# Patient Record
Sex: Female | Born: 1935
Health system: Southern US, Community
[De-identification: ages and names within clinical notes are randomized; demographics above are authoritative.]

## PROBLEM LIST (undated history)

## (undated) DIAGNOSIS — J3089 Other allergic rhinitis: Secondary | ICD-10-CM

## (undated) DIAGNOSIS — K449 Diaphragmatic hernia without obstruction or gangrene: Secondary | ICD-10-CM

## (undated) DIAGNOSIS — M1990 Unspecified osteoarthritis, unspecified site: Secondary | ICD-10-CM

## (undated) DIAGNOSIS — K219 Gastro-esophageal reflux disease without esophagitis: Secondary | ICD-10-CM

## (undated) DIAGNOSIS — I499 Cardiac arrhythmia, unspecified: Secondary | ICD-10-CM

## (undated) HISTORY — PX: ROTATOR CUFF REPAIR: SHX139

## (undated) HISTORY — DX: Unspecified osteoarthritis, unspecified site: M19.90

## (undated) HISTORY — PX: OTHER SURGICAL HISTORY: SHX169

## (undated) HISTORY — PX: DUPUYTREN CONTRACTURE RELEASE: SHX1478

## (undated) HISTORY — PX: WRIST SURGERY: SHX841

## (undated) HISTORY — PX: KNEE SURGERY: SHX244

## (undated) HISTORY — PX: CATARACT EXTRACTION W/ INTRAOCULAR LENS  IMPLANT, BILATERAL: SHX1307

---

## 2004-11-18 ENCOUNTER — Other Ambulatory Visit: Payer: Self-pay

## 2004-11-18 ENCOUNTER — Ambulatory Visit: Payer: Self-pay | Admitting: Unknown Physician Specialty

## 2004-11-19 ENCOUNTER — Ambulatory Visit: Payer: Self-pay | Admitting: Unknown Physician Specialty

## 2005-09-01 ENCOUNTER — Ambulatory Visit: Payer: Self-pay | Admitting: Internal Medicine

## 2005-09-21 ENCOUNTER — Other Ambulatory Visit: Payer: Self-pay

## 2005-10-16 ENCOUNTER — Inpatient Hospital Stay: Payer: Self-pay | Admitting: General Practice

## 2005-10-19 ENCOUNTER — Encounter: Payer: Self-pay | Admitting: Internal Medicine

## 2005-10-28 ENCOUNTER — Encounter: Payer: Self-pay | Admitting: Internal Medicine

## 2005-10-30 ENCOUNTER — Encounter: Payer: Self-pay | Admitting: General Practice

## 2005-11-27 ENCOUNTER — Encounter: Payer: Self-pay | Admitting: Internal Medicine

## 2005-12-28 ENCOUNTER — Encounter: Payer: Self-pay | Admitting: Internal Medicine

## 2006-01-28 ENCOUNTER — Encounter: Payer: Self-pay | Admitting: Internal Medicine

## 2006-02-25 ENCOUNTER — Encounter: Payer: Self-pay | Admitting: Internal Medicine

## 2006-03-28 ENCOUNTER — Encounter: Payer: Self-pay | Admitting: Internal Medicine

## 2006-04-27 ENCOUNTER — Encounter: Payer: Self-pay | Admitting: Internal Medicine

## 2006-05-28 ENCOUNTER — Encounter: Payer: Self-pay | Admitting: Internal Medicine

## 2006-06-27 ENCOUNTER — Encounter: Payer: Self-pay | Admitting: Internal Medicine

## 2006-07-28 ENCOUNTER — Encounter: Payer: Self-pay | Admitting: Internal Medicine

## 2006-08-28 ENCOUNTER — Encounter: Payer: Self-pay | Admitting: Internal Medicine

## 2006-09-27 ENCOUNTER — Encounter: Payer: Self-pay | Admitting: Internal Medicine

## 2006-09-29 ENCOUNTER — Ambulatory Visit: Payer: Self-pay | Admitting: Internal Medicine

## 2006-10-28 ENCOUNTER — Encounter: Payer: Self-pay | Admitting: Internal Medicine

## 2007-03-16 ENCOUNTER — Ambulatory Visit: Payer: Self-pay | Admitting: Internal Medicine

## 2007-07-04 ENCOUNTER — Ambulatory Visit: Payer: Self-pay | Admitting: Unknown Physician Specialty

## 2007-10-03 ENCOUNTER — Ambulatory Visit: Payer: Self-pay | Admitting: Internal Medicine

## 2008-10-03 ENCOUNTER — Ambulatory Visit: Payer: Self-pay | Admitting: Internal Medicine

## 2009-10-16 ENCOUNTER — Ambulatory Visit: Payer: Self-pay | Admitting: Internal Medicine

## 2010-11-03 ENCOUNTER — Ambulatory Visit: Payer: Self-pay | Admitting: Internal Medicine

## 2011-12-07 ENCOUNTER — Ambulatory Visit: Payer: Self-pay | Admitting: Internal Medicine

## 2012-07-20 ENCOUNTER — Ambulatory Visit: Payer: Self-pay | Admitting: Ophthalmology

## 2012-07-20 DIAGNOSIS — I499 Cardiac arrhythmia, unspecified: Secondary | ICD-10-CM

## 2012-08-09 ENCOUNTER — Ambulatory Visit: Payer: Self-pay | Admitting: Ophthalmology

## 2012-12-07 ENCOUNTER — Ambulatory Visit: Payer: Self-pay | Admitting: Internal Medicine

## 2013-03-28 ENCOUNTER — Ambulatory Visit: Payer: Self-pay | Admitting: Ophthalmology

## 2013-04-11 ENCOUNTER — Ambulatory Visit: Payer: Self-pay | Admitting: Ophthalmology

## 2013-12-12 ENCOUNTER — Ambulatory Visit: Payer: Self-pay | Admitting: Internal Medicine

## 2014-05-22 DIAGNOSIS — M810 Age-related osteoporosis without current pathological fracture: Secondary | ICD-10-CM | POA: Insufficient documentation

## 2014-05-22 DIAGNOSIS — E785 Hyperlipidemia, unspecified: Secondary | ICD-10-CM | POA: Insufficient documentation

## 2014-12-25 ENCOUNTER — Ambulatory Visit: Payer: Self-pay | Admitting: Internal Medicine

## 2015-04-16 NOTE — Op Note (Signed)
PATIENT NAME:  Heather Miranda, Heather Miranda MR#:  [id number] DATE OF BIRTH:  [birth year]  DATE OF PROCEDURE:  08/09/2012  PREOPERATIVE DIAGNOSIS: Visually significant cataract of the right eye.   POSTOPERATIVE DIAGNOSIS: Visually significant cataract of the right eye.   OPERATIVE PROCEDURE: Cataract extraction by phacoemulsification with implant of intraocular lens to right eye.   SURGEON: Birder Robson, MD.   ANESTHESIA:  1. Managed anesthesia care.  2. Topical tetracaine drops followed by 2% Xylocaine jelly applied in the preoperative holding area.   COMPLICATIONS: None.   TECHNIQUE:  Stop-and-chop    DESCRIPTION OF PROCEDURE: The patient was examined and consented in the preoperative holding area where the aforementioned topical anesthesia was applied to the right eye and then brought back to the Operating Room where the right eye was prepped and draped in the usual sterile ophthalmic fashion and a lid speculum was placed. A paracentesis was created with the side port blade and the anterior chamber was filled with viscoelastic. A near clear corneal incision was performed with the steel keratome. A continuous curvilinear capsulorrhexis was performed with a cystotome followed by the capsulorrhexis forceps. Hydrodissection and hydrodelineation were carried out with BSS on a blunt cannula. The lens was removed in a stop-and-chop technique and the remaining cortical material was removed with the irrigation-aspiration handpiece. The capsular bag was inflated with viscoelastic and the Technus ZCB00 23.0-diopter lens, serial number [id number] was placed in the capsular bag without complication. The remaining viscoelastic was removed from the eye with the irrigation-aspiration handpiece. The wounds were hydrated. The anterior chamber was flushed with Miostat and the eye was inflated to physiologic pressure. The wounds were found to be water tight. The eye was dressed with Vigamox. The patient was given protective  glasses to wear throughout the day and a shield with which to sleep tonight. The patient was also given drops with which to begin a drop regimen today and will follow-up with me in one day.   ____________________________ Livingston Diones. Prentis Langdon, MD wlp:drc D: 08/09/2012 16:59:57 ET T: 08/09/2012 17:43:02 ET JOB#: [id number]  cc: [place] Nick L. Rondrick Barreira, MD, <Dictator> Livingston Diones Kirat Mezquita MD ELECTRONICALLY SIGNED 08/10/2012 15:32

## 2015-04-19 NOTE — Op Note (Signed)
PATIENT NAME:  Heather Miranda, Heather Miranda MR#:  [id number] DATE OF BIRTH:  1929/12/26  DATE OF PROCEDURE:  04/11/2013  PREOPERATIVE DIAGNOSIS: Visually significant cataract of the left eye.   POSTOPERATIVE DIAGNOSIS: Visually significant cataract of the left eye.   OPERATIVE PROCEDURE: Cataract extraction by phacoemulsification with implant of intraocular lens to left eye.   SURGEON: Birder Robson, MD.   ANESTHESIA:  1. Managed anesthesia care.  2. Topical tetracaine drops followed by 2% Xylocaine jelly applied in the preoperative holding area.   COMPLICATIONS: None.   TECHNIQUE:  Stop and chop.  DESCRIPTION OF PROCEDURE: The patient was examined and consented in the preoperative holding area where the aforementioned topical anesthesia was applied to the left eye and then brought back to the Operating Room where the left eye was prepped and draped in the usual sterile ophthalmic fashion and a lid speculum was placed. A paracentesis was created with the side port blade and the anterior chamber was filled with viscoelastic. A near clear corneal incision was performed with the steel keratome. A continuous curvilinear capsulorrhexis was performed with a cystotome followed by the capsulorrhexis forceps. Hydrodissection and hydrodelineation were carried out with BSS on a blunt cannula. The lens was removed in a stop and chop technique and the remaining cortical material was removed with the irrigation-aspiration handpiece. The capsular bag was inflated with viscoelastic and the Tecnis ZCB00 23.0-diopter lens, serial number [id number] was placed in the capsular bag without complication. The remaining viscoelastic was removed from the eye with the irrigation-aspiration handpiece. The wounds were hydrated. The anterior chamber was flushed with Miostat and the eye was inflated to physiologic pressure. 0.1 mL of cefuroxime concentration 10 mg/mL was placed in the anterior chamber. The wounds were found to be water  tight. The eye was dressed with Vigamox. The patient was given protective glasses to wear throughout the day and a shield with which to sleep tonight. The patient was also given drops with which to begin a drop regimen today and will follow-up with me in one day.    ____________________________ Livingston Diones. Cornelio Parkerson, MD wlp:OSi D: 04/11/2013 12:41:04 ET T: 04/11/2013 13:34:58 ET JOB#: [id number]  cc: Custer Pimenta L. Artez Regis, MD, <Dictator> Livingston Diones Bard Haupert MD ELECTRONICALLY SIGNED 04/12/2013 13:45

## 2015-09-13 DIAGNOSIS — Z86018 Personal history of other benign neoplasm: Secondary | ICD-10-CM

## 2015-09-13 HISTORY — DX: Personal history of other benign neoplasm: Z86.018

## 2015-10-14 DIAGNOSIS — Z96651 Presence of right artificial knee joint: Secondary | ICD-10-CM | POA: Insufficient documentation

## 2015-11-27 ENCOUNTER — Other Ambulatory Visit: Payer: Self-pay | Admitting: Internal Medicine

## 2015-11-27 DIAGNOSIS — Z1231 Encounter for screening mammogram for malignant neoplasm of breast: Secondary | ICD-10-CM

## 2015-12-27 ENCOUNTER — Ambulatory Visit
Admission: RE | Admit: 2015-12-27 | Discharge: 2015-12-27 | Disposition: A | Discharge status: Home / Self Care | Payer: Medicare Other | Source: Ambulatory Visit | Attending: Internal Medicine | Admitting: Internal Medicine

## 2015-12-27 DIAGNOSIS — Z1231 Encounter for screening mammogram for malignant neoplasm of breast: Secondary | ICD-10-CM

## 2016-02-10 DIAGNOSIS — M47812 Spondylosis without myelopathy or radiculopathy, cervical region: Secondary | ICD-10-CM | POA: Insufficient documentation

## 2016-02-10 DIAGNOSIS — M159 Polyosteoarthritis, unspecified: Secondary | ICD-10-CM | POA: Insufficient documentation

## 2016-02-10 DIAGNOSIS — M542 Cervicalgia: Secondary | ICD-10-CM | POA: Insufficient documentation

## 2016-03-06 ENCOUNTER — Emergency Department: Payer: Medicare Other

## 2016-03-06 ENCOUNTER — Encounter: Payer: Self-pay | Admitting: Emergency Medicine

## 2016-03-06 ENCOUNTER — Emergency Department
Admission: EM | Admit: 2016-03-06 | Discharge: 2016-03-07 | Disposition: A | Discharge status: Home / Self Care | Payer: Medicare Other | Attending: Emergency Medicine | Admitting: Emergency Medicine

## 2016-03-06 DIAGNOSIS — R079 Chest pain, unspecified: Secondary | ICD-10-CM | POA: Diagnosis present

## 2016-03-06 LAB — CBC
HCT: 40.9 % (ref 35.0–47.0)
Hemoglobin: 13.9 g/dL (ref 12.0–16.0)
MCH: 31.1 pg (ref 26.0–34.0)
MCHC: 34 g/dL (ref 32.0–36.0)
MCV: 91.4 fL (ref 80.0–100.0)
Platelets: 287 10*3/uL (ref 150–440)
RBC: 4.47 MIL/uL (ref 3.80–5.20)
RDW: 13.2 % (ref 11.5–14.5)
WBC: 16 10*3/uL — ABNORMAL HIGH (ref 3.6–11.0)

## 2016-03-06 LAB — BASIC METABOLIC PANEL
ANION GAP: 12 (ref 5–15)
BUN: 27 mg/dL — AB (ref 6–20)
CALCIUM: 12 mg/dL — AB (ref 8.9–10.3)
CO2: 25 mmol/L (ref 22–32)
Chloride: 94 mmol/L — ABNORMAL LOW (ref 101–111)
Creatinine, Ser: 1.07 mg/dL — ABNORMAL HIGH (ref 0.44–1.00)
GFR calc Af Amer: 54 mL/min — ABNORMAL LOW (ref >60)
GFR, EST NON AFRICAN AMERICAN: 46 mL/min — AB (ref >60)
GLUCOSE: 156 mg/dL — AB (ref 65–99)
Potassium: 3.8 mmol/L (ref 3.5–5.1)
Sodium: 131 mmol/L — ABNORMAL LOW (ref 135–145)

## 2016-03-06 LAB — TROPONIN I

## 2016-03-06 NOTE — ED Provider Notes (Signed)
[facility] Emergency Department Provider Note  Time seen: 11:02 PM  I have reviewed the triage vital signs and the nursing notes.   HISTORY  Chief Complaint Chest Pain    HPI Heather Miranda is a 82 y.o. female presents the emergency department with 2 days of intermittent chest discomfort. According to the patient for the past 2 day she will have intermittent chest tightness which comes and goes. Denies any chest discomfort currently. Denies shortness of breath, diaphoresis. Patient does state occasional nausea but she thinks this could be related to the new medication she is on. Patient had a tooth extraction 3 days ago and is now taking tramadol for discomfort as well as amoxicillin prophylactically. Patient denies any fever, cough, congestion. Denies abdominal pain, vomiting, diarrhea. Denies any cardiac history.States the chest tightness is mild when it occurs in a bandlike pattern across the middle of her chest, denies any currently. States when it does come it lasts for several minutes at a time before resolving.     No past medical history on file.  There are no active problems to display for this patient.   Past Surgical History  Procedure Laterality Date  . Knee surgery      right  . Wrist surgery      right    No current outpatient prescriptions on file.  Allergies Morphine and related and Percocet  Family History  Problem Relation Age of Onset  . Breast cancer Mother 57  . Breast cancer Maternal Aunt     Social History Social History  Substance Use Topics  . Smoking status: Never Smoker   . Smokeless tobacco: None  . Alcohol Use: No    Review of Systems Constitutional: Negative for fever. Cardiovascular: Intermittent chest tightness Respiratory: Negative for shortness of breath. Gastrointestinal: Negative for abdominal pain Musculoskeletal: Negative for back pain. Denies leg pain or swelling. Neurological: Negative for  headache 10-point ROS otherwise negative.  ____________________________________________   PHYSICAL EXAM:  VITAL SIGNS: ED Triage Vitals  Enc Vitals Group     BP 03/06/16 1923 184/82 mmHg     Pulse Rate 03/06/16 1923 80     Resp 03/06/16 1923 18     Temp 03/06/16 1923 98.3 F (36.8 C)     Temp Source 03/06/16 1923 Oral     SpO2 03/06/16 1923 96 %     Weight 03/06/16 1923 140 lb (63.504 kg)     Height 03/06/16 1923 5\' 1"  (1.549 m)     Head Cir --      Peak Flow --      Pain Score 03/06/16 1926 8     Pain Loc --      Pain Edu? --      Excl. in [place]? --     Constitutional: Alert and oriented. Well appearing and in no distress. Eyes: Normal exam ENT   Head: Normocephalic and atraumatic.   Mouth/Throat: Mucous membranes are moist. Cardiovascular: Normal rate, regular rhythm.  Respiratory: Normal respiratory effort without tachypnea nor retractions. Breath sounds are clear. Moderate chest tenderness palpation over the sternum. Gastrointestinal: Soft and nontender. No distention.  Musculoskeletal: Nontender with normal range of motion in all extremities. No lower extremity tenderness or edema. Neurologic:  Normal speech and language. No gross focal neurologic  Skin:  Skin is warm, dry and intact.  Psychiatric: Mood and affect are normal. Speech and behavior are normal.   ____________________________________________    EKG  EKG reviewed and interpreted by  myself shows normal sinus rhythm 82 bpm, narrow QRS, normal axis, normal intervals, no ST changes, Q waves inferiorly, otherwise normal EKG.  ____________________________________________    RADIOLOGY  Chest x-ray shows no acute abnormality  ____________________________________________    INITIAL IMPRESSION / ASSESSMENT AND PLAN / ED COURSE  Pertinent labs & imaging results that were available during my care of the patient were reviewed by me and considered in my medical decision making (see chart for  details).  Patient presents with intermittent chest tightness over the past 2 days. Patient is also taking new medications of tramadol and amoxicillin. Patient's labs are largely within normal limits besides a mild leukocytosis which is likely result of the patient's recent tooth extraction. Patient denies any chest discomfort currently. Patient does have moderate chest tenderness to palpation, no abdominal tenderness. We will send a repeat troponin 3 hours after the initial troponin. If normal we will discharge home. I discussed with the patient the need to follow-up with a cardiologist for a stress test, she is agreeable to this plan.  Second troponin negative. Patient remains chest pain-free. We'll discharge the patient with PCP and cardiology follow-up. Patient is agreeable to plan. I sent a note to her primary care physician Dr. Doy Hutching to help arrange the stress test. ____________________________________________   FINAL CLINICAL IMPRESSION(S) / ED DIAGNOSES  Chest pain   Harvest Dark, MD 03/07/16 0005

## 2016-03-06 NOTE — ED Notes (Signed)
Pt. States having two teeth pulled two days ago.  Pt. States intermittent chest tightness for the past two days worse today.  Pt. Denies cardiac hx.

## 2016-03-07 MED ORDER — ONDANSETRON 4 MG PO TBDP
4.0000 mg | ORAL_TABLET | Freq: Once | ORAL | Status: AC
  Administered 2016-03-07: 4 mg via ORAL
  Filled 2016-03-07: qty 1

## 2016-03-07 MED ORDER — CEFAZOLIN SODIUM-DEXTROSE 2-3 GM-% IV SOLR: 2.0000 g | Freq: Once | INTRAVENOUS | Status: DC

## 2016-03-07 MED ORDER — ONDANSETRON 4 MG PO TBDP: 4.0000 mg | ORAL_TABLET | Freq: Three times a day (TID) | ORAL | Status: DC | PRN

## 2016-03-07 NOTE — Discharge Instructions (Signed)
You have been seen in the emergency department today for chest pain. Your workup has shown normal results. As we discussed please follow-up with your primary care physician in the next 1-2 days for recheck. Return to the emergency department for any further chest pain, trouble breathing, or any other symptom personally concerning to yourself. °Please call the number provided for cardiology to arrange a stress test as soon as possible. ° ° °Nonspecific Chest Pain °It is often hard to find the cause of chest pain. There is always a chance that your pain could be related to something serious, such as a heart attack or a blood clot in your lungs. Chest pain can also be caused by conditions that are not life-threatening. If you have chest pain, it is very important to follow up with your doctor. ° °HOME CARE °· If you were prescribed an antibiotic medicine, finish it all even if you start to feel better. °· Avoid any activities that cause chest pain. °· Do not use any tobacco products, including cigarettes, chewing tobacco, or electronic cigarettes. If you need help quitting, ask your doctor. °· Do not drink alcohol. °· Take medicines only as told by your doctor. °· Keep all follow-up visits as told by your doctor. This is important. This includes any further testing if your chest pain does not go away. °· Your doctor may tell you to keep your head raised (elevated) while you sleep. °· Make lifestyle changes as told by your doctor. These may include: °¨ Getting regular exercise. Ask your doctor to suggest some activities that are safe for you. °¨ Eating a heart-healthy diet. Your doctor or a diet specialist (dietitian) can help you to learn healthy eating options. °¨ Maintaining a healthy weight. °¨ Managing diabetes, if necessary. °¨ Reducing stress. °GET HELP IF: °· Your chest pain does not go away, even after treatment. °· You have a rash with blisters on your chest. °· You have a fever. °GET HELP RIGHT AWAY  IF: °· Your chest pain is worse. °· You have an increasing cough, or you cough up blood. °· You have severe belly (abdominal) pain. °· You feel extremely weak. °· You pass out (faint). °· You have chills. °· You have sudden, unexplained chest discomfort. °· You have sudden, unexplained discomfort in your arms, back, neck, or jaw. °· You have shortness of breath at any time. °· You suddenly start to sweat, or your skin gets clammy. °· You feel nauseous. °· You vomit. °· You suddenly feel light-headed or dizzy. °· Your heart begins to beat quickly, or it feels like it is skipping beats. °These symptoms may be an emergency. Do not wait to see if the symptoms will go away. Get medical help right away. Call your local emergency services (911 in the [place] Do not drive yourself to the hospital. °  °This information is not intended to replace advice given to you by your health care provider. Make sure you discuss any questions you have with your health care provider. °  °Document Released: 06/01/2008 Document Revised: 01/04/2015 Document Reviewed: 07/20/2014 °Elsevier Interactive Patient Education ©2016 [employer] ° °

## 2016-03-23 DIAGNOSIS — R079 Chest pain, unspecified: Secondary | ICD-10-CM | POA: Insufficient documentation

## 2016-10-06 DIAGNOSIS — I351 Nonrheumatic aortic (valve) insufficiency: Secondary | ICD-10-CM | POA: Insufficient documentation

## 2016-10-16 ENCOUNTER — Other Ambulatory Visit: Payer: Self-pay | Admitting: Orthopedic Surgery

## 2016-10-16 DIAGNOSIS — M5416 Radiculopathy, lumbar region: Secondary | ICD-10-CM

## 2016-11-04 ENCOUNTER — Ambulatory Visit
Admission: RE | Admit: 2016-11-04 | Discharge: 2016-11-04 | Disposition: A | Discharge status: Home / Self Care | Payer: Medicare Other | Source: Ambulatory Visit | Attending: Orthopedic Surgery | Admitting: Orthopedic Surgery

## 2016-11-04 DIAGNOSIS — M5416 Radiculopathy, lumbar region: Secondary | ICD-10-CM

## 2016-11-04 DIAGNOSIS — M48061 Spinal stenosis, lumbar region without neurogenic claudication: Secondary | ICD-10-CM | POA: Diagnosis not present

## 2016-11-04 DIAGNOSIS — M5116 Intervertebral disc disorders with radiculopathy, lumbar region: Secondary | ICD-10-CM | POA: Insufficient documentation

## 2016-11-11 ENCOUNTER — Other Ambulatory Visit: Payer: Self-pay | Admitting: Orthopedic Surgery

## 2016-11-11 DIAGNOSIS — M48062 Spinal stenosis, lumbar region with neurogenic claudication: Secondary | ICD-10-CM

## 2016-11-18 ENCOUNTER — Ambulatory Visit
Admission: RE | Admit: 2016-11-18 | Discharge: 2016-11-18 | Disposition: A | Discharge status: Home / Self Care | Payer: Medicare Other | Source: Ambulatory Visit | Attending: Orthopedic Surgery | Admitting: Orthopedic Surgery

## 2016-11-18 ENCOUNTER — Other Ambulatory Visit: Payer: Self-pay | Admitting: Internal Medicine

## 2016-11-18 DIAGNOSIS — Z1231 Encounter for screening mammogram for malignant neoplasm of breast: Secondary | ICD-10-CM

## 2016-11-18 DIAGNOSIS — M48062 Spinal stenosis, lumbar region with neurogenic claudication: Secondary | ICD-10-CM

## 2016-11-18 MED ORDER — IOPAMIDOL (ISOVUE-M 200) INJECTION 41%
1.0000 mL | Freq: Once | INTRAMUSCULAR | Status: AC
  Administered 2016-11-18: 1 mL via EPIDURAL

## 2016-11-18 MED ORDER — METHYLPREDNISOLONE ACETATE 40 MG/ML INJ SUSP (RADIOLOG
120.0000 mg | Freq: Once | INTRAMUSCULAR | Status: AC
  Administered 2016-11-18: 120 mg via EPIDURAL

## 2016-11-18 NOTE — Discharge Instructions (Signed)

## 2016-12-30 ENCOUNTER — Ambulatory Visit
Admission: RE | Admit: 2016-12-30 | Discharge: 2016-12-30 | Disposition: A | Discharge status: Home / Self Care | Payer: PPO | Source: Ambulatory Visit | Attending: Internal Medicine | Admitting: Internal Medicine

## 2016-12-30 DIAGNOSIS — Z1231 Encounter for screening mammogram for malignant neoplasm of breast: Secondary | ICD-10-CM | POA: Diagnosis not present

## 2017-01-12 ENCOUNTER — Other Ambulatory Visit: Payer: Self-pay | Admitting: Nurse Practitioner

## 2017-01-12 DIAGNOSIS — K219 Gastro-esophageal reflux disease without esophagitis: Secondary | ICD-10-CM

## 2017-01-12 DIAGNOSIS — K449 Diaphragmatic hernia without obstruction or gangrene: Secondary | ICD-10-CM

## 2017-01-12 DIAGNOSIS — R634 Abnormal weight loss: Secondary | ICD-10-CM | POA: Diagnosis not present

## 2017-01-12 DIAGNOSIS — R140 Abdominal distension (gaseous): Secondary | ICD-10-CM | POA: Diagnosis not present

## 2017-01-18 ENCOUNTER — Emergency Department: Admission: EM | Admit: 2017-01-18 | Payer: PPO

## 2017-01-20 ENCOUNTER — Ambulatory Visit
Admission: RE | Admit: 2017-01-20 | Discharge: 2017-01-20 | Disposition: A | Discharge status: Home / Self Care | Payer: PPO | Source: Ambulatory Visit | Attending: Nurse Practitioner | Admitting: Nurse Practitioner

## 2017-01-20 ENCOUNTER — Ambulatory Visit: Admission: RE | Admit: 2017-01-20 | Payer: PPO | Source: Ambulatory Visit

## 2017-01-20 DIAGNOSIS — K449 Diaphragmatic hernia without obstruction or gangrene: Secondary | ICD-10-CM | POA: Diagnosis not present

## 2017-01-20 DIAGNOSIS — R634 Abnormal weight loss: Secondary | ICD-10-CM | POA: Diagnosis not present

## 2017-01-20 DIAGNOSIS — K219 Gastro-esophageal reflux disease without esophagitis: Secondary | ICD-10-CM | POA: Diagnosis not present

## 2017-01-20 DIAGNOSIS — R140 Abdominal distension (gaseous): Secondary | ICD-10-CM | POA: Diagnosis not present

## 2017-03-03 DIAGNOSIS — Z79899 Other long term (current) drug therapy: Secondary | ICD-10-CM | POA: Diagnosis not present

## 2017-03-03 DIAGNOSIS — I10 Essential (primary) hypertension: Secondary | ICD-10-CM | POA: Diagnosis not present

## 2017-03-03 DIAGNOSIS — M1990 Unspecified osteoarthritis, unspecified site: Secondary | ICD-10-CM | POA: Diagnosis not present

## 2017-03-03 DIAGNOSIS — E7800 Pure hypercholesterolemia, unspecified: Secondary | ICD-10-CM | POA: Diagnosis not present

## 2017-03-03 DIAGNOSIS — E119 Type 2 diabetes mellitus without complications: Secondary | ICD-10-CM | POA: Diagnosis not present

## 2017-04-05 DIAGNOSIS — M5136 Other intervertebral disc degeneration, lumbar region: Secondary | ICD-10-CM | POA: Diagnosis not present

## 2017-04-05 DIAGNOSIS — M48062 Spinal stenosis, lumbar region with neurogenic claudication: Secondary | ICD-10-CM | POA: Diagnosis not present

## 2017-04-05 DIAGNOSIS — M5416 Radiculopathy, lumbar region: Secondary | ICD-10-CM | POA: Diagnosis not present

## 2017-04-30 DIAGNOSIS — M5416 Radiculopathy, lumbar region: Secondary | ICD-10-CM | POA: Diagnosis not present

## 2017-04-30 DIAGNOSIS — M48062 Spinal stenosis, lumbar region with neurogenic claudication: Secondary | ICD-10-CM | POA: Diagnosis not present

## 2017-04-30 DIAGNOSIS — M5136 Other intervertebral disc degeneration, lumbar region: Secondary | ICD-10-CM | POA: Diagnosis not present

## 2017-05-07 ENCOUNTER — Other Ambulatory Visit (INDEPENDENT_AMBULATORY_CARE_PROVIDER_SITE_OTHER): Payer: PPO

## 2017-05-07 ENCOUNTER — Other Ambulatory Visit (INDEPENDENT_AMBULATORY_CARE_PROVIDER_SITE_OTHER): Payer: Self-pay | Admitting: Vascular Surgery

## 2017-05-07 ENCOUNTER — Encounter (INDEPENDENT_AMBULATORY_CARE_PROVIDER_SITE_OTHER): Payer: Self-pay | Admitting: Vascular Surgery

## 2017-05-07 ENCOUNTER — Ambulatory Visit (INDEPENDENT_AMBULATORY_CARE_PROVIDER_SITE_OTHER): Payer: PPO | Admitting: Vascular Surgery

## 2017-05-07 VITALS — BP 137/71 | HR 63 | Resp 16 | Wt 128.0 lb

## 2017-05-07 DIAGNOSIS — I6523 Occlusion and stenosis of bilateral carotid arteries: Secondary | ICD-10-CM | POA: Diagnosis not present

## 2017-05-07 DIAGNOSIS — M545 Low back pain, unspecified: Secondary | ICD-10-CM | POA: Insufficient documentation

## 2017-05-07 DIAGNOSIS — I6529 Occlusion and stenosis of unspecified carotid artery: Secondary | ICD-10-CM

## 2017-05-07 NOTE — Assessment & Plan Note (Signed)
This has been refractory to injections and remains quite painful and debilitating for her. This is her biggest problem at the moment.

## 2017-05-07 NOTE — Assessment & Plan Note (Signed)
Her carotid duplex today shows stable stenosis in both carotid arteries which measures in the 40-59% range on the right and in the 1-39% range on the left. We have discussed the pathophysiology and natural history of carotid disease. Given her asymptomatic status and the mild to moderate nature of disease, no intervention is currently recommended. She should continue aspirin therapy. Plan to recheck in 1 year.

## 2017-05-07 NOTE — Progress Notes (Signed)
MRN : [id number]  Heather Miranda is a 83 y.o. (24-Jun-1929) female who presents with chief complaint of  Chief Complaint  Patient presents with  . Follow-up  .  History of Present Illness: Patient returns in follow-up of her carotid disease. She is been having significant back troubles, but has not had any problems from her carotids. She denies focal neurologic symptoms. Specifically, the patient denies amaurosis fugax, speech or swallowing difficulties, or arm or leg weakness or numbness. Her carotid duplex today shows stable stenosis in both carotid arteries which measures in the 40-59% range on the right and in the 1-39% range on the left.  Current Outpatient Prescriptions  Medication Sig Dispense Refill  . aspirin EC 81 MG tablet Take by mouth.    Marland Kitchen atenolol (TENORMIN) 25 MG tablet     . calcium carbonate (OSCAL) 1500 (600 Ca) MG TABS tablet Take by mouth 2 (two) times daily with a meal.    . cyclobenzaprine (FLEXERIL) 10 MG tablet Take by mouth.    . fexofenadine (ALLEGRA) 60 MG tablet Take by mouth.    Marland Kitchen guaiFENesin (MUCINEX) 600 MG 12 hr tablet Take by mouth.    . Multiple Vitamins-Minerals (CENTRUM SILVER 50+WOMEN PO) Take by mouth daily.    . Omega-3 Fatty Acids (FISH OIL PO) Take by mouth.    Marland Kitchen omeprazole (PRILOSEC) 20 MG capsule     . ondansetron (ZOFRAN ODT) 4 MG disintegrating tablet Take 1 tablet (4 mg total) by mouth every 8 (eight) hours as needed for nausea or vomiting. 20 tablet 0   No current facility-administered medications for this visit.     Past Medical History:  Diagnosis Date  . Arthritis     Past Surgical History:  Procedure Laterality Date  . KNEE SURGERY     right  . WRIST SURGERY     right    Social History Social History  Substance Use Topics  . Smoking status: Never Smoker  . Smokeless tobacco: Never Used  . Alcohol use No    Family History Family History  Problem Relation Age of Onset  . Breast cancer Mother 40  . Breast cancer  Maternal Aunt     Allergies  Allergen Reactions  . Morphine And Related Nausea Only  . Percocet [Oxycodone-Acetaminophen] Nausea Only     REVIEW OF SYSTEMS (Negative unless checked)  Constitutional: [] Weight loss  [] Fever  [] Chills Cardiac: [] Chest pain   [] Chest pressure   [] Palpitations   [] Shortness of breath when laying flat   [] Shortness of breath at rest   [] Shortness of breath with exertion. Vascular:  [] Pain in legs with walking   [] Pain in legs at rest   [] Pain in legs when laying flat   [] Claudication   [] Pain in feet when walking  [] Pain in feet at rest  [] Pain in feet when laying flat   [] History of DVT   [] Phlebitis   [] Swelling in legs   [] Varicose veins   [] Non-healing ulcers Pulmonary:   [] Uses home oxygen   [] Productive cough   [] Hemoptysis   [] Wheeze  [] COPD   [] Asthma Neurologic:  [] Dizziness  [] Blackouts   [] Seizures   [] History of stroke   [] History of TIA  [] Aphasia   [] Temporary blindness   [] Dysphagia   [] Weakness or numbness in arms   [] Weakness or numbness in legs Musculoskeletal:  [x] Arthritis   [] Joint swelling   [] Joint pain   [x] Low back pain Hematologic:  [] Easy bruising  [] Easy bleeding   []   Hypercoagulable state   [] Anemic  [] Hepatitis Gastrointestinal:  [] Blood in stool   [] Vomiting blood  [] Gastroesophageal reflux/heartburn   [] Difficulty swallowing. Genitourinary:  [] Chronic kidney disease   [] Difficult urination  [] Frequent urination  [] Burning with urination   [] Blood in urine Skin:  [] Rashes   [] Ulcers   [] Wounds Psychological:  [] History of anxiety   []  History of major depression.  Physical Examination  Vitals:   05/07/17 1357  BP: 137/71  Pulse: 63  Resp: 16  Weight: 128 lb (58.1 kg)   Body mass index is 24.19 kg/m. Gen:  WD/WN, NAD. Appears younger than stated age Head: [place]/AT, No temporalis wasting. Ear/Nose/Throat: Hearing grossly intact, nares w/o erythema or drainage, trachea midline Eyes: Conjunctiva clear. Sclera non-icteric Neck:  Supple.  No JVD. Soft right carotid bruit present Pulmonary:  Good air movement, equal and clear to auscultation bilaterally.  Cardiac: RRR, normal S1, S2, no Murmurs, rubs or gallops. Vascular:  Vessel Right Left  Radial Palpable Palpable                                   Gastrointestinal: soft, non-tender/non-distended.  Musculoskeletal: M/S 5/5 throughout.  No deformity or atrophy Neurologic: CN 2-12 intact. Sensation grossly intact in extremities.  Symmetrical.  Speech is fluent. Motor exam as listed above. Psychiatric: Judgment intact, Mood & affect appropriate for pt's clinical situation. Dermatologic: No rashes or ulcers noted.  No cellulitis or open wounds.      CBC Lab Results  Component Value Date   WBC 16.0 (H) 03/06/2016   HGB 13.9 03/06/2016   HCT 40.9 03/06/2016   MCV 91.4 03/06/2016   PLT 287 03/06/2016    BMET    Component Value Date/Time   NA 131 (L) 03/06/2016 1933   K 3.8 03/06/2016 1933   CL 94 (L) 03/06/2016 1933   CO2 25 03/06/2016 1933   GLUCOSE 156 (H) 03/06/2016 1933   BUN 27 (H) 03/06/2016 1933   CREATININE 1.07 (H) 03/06/2016 1933   CALCIUM 12.0 (H) 03/06/2016 1933   GFRNONAA 46 (L) 03/06/2016 1933   GFRAA 54 (L) 03/06/2016 1933   CrCl cannot be calculated (Patient's most recent lab result is older than the maximum 21 days allowed.).  COAG No results found for: INR, PROTIME  Radiology No results found.    Assessment/Plan Low back pain This has been refractory to injections and remains quite painful and debilitating for her. This is her biggest problem at the moment.  Carotid stenosis Her carotid duplex today shows stable stenosis in both carotid arteries which measures in the 40-59% range on the right and in the 1-39% range on the left. We have discussed the pathophysiology and natural history of carotid disease. Given her asymptomatic status and the mild to moderate nature of disease, no intervention is currently  recommended. She should continue aspirin therapy. Plan to recheck in 1 year.    Leotis Pain, MD  05/07/2017 2:41 PM    This note was created with Dragon medical transcription system.  Any errors from dictation are purely unintentional

## 2017-05-07 NOTE — Patient Instructions (Signed)

## 2017-05-26 DIAGNOSIS — M48062 Spinal stenosis, lumbar region with neurogenic claudication: Secondary | ICD-10-CM | POA: Diagnosis not present

## 2017-05-26 DIAGNOSIS — M5416 Radiculopathy, lumbar region: Secondary | ICD-10-CM | POA: Diagnosis not present

## 2017-05-26 DIAGNOSIS — M5136 Other intervertebral disc degeneration, lumbar region: Secondary | ICD-10-CM | POA: Diagnosis not present

## 2017-05-31 DIAGNOSIS — Z79899 Other long term (current) drug therapy: Secondary | ICD-10-CM | POA: Diagnosis not present

## 2017-05-31 DIAGNOSIS — E119 Type 2 diabetes mellitus without complications: Secondary | ICD-10-CM | POA: Diagnosis not present

## 2017-05-31 DIAGNOSIS — I10 Essential (primary) hypertension: Secondary | ICD-10-CM | POA: Diagnosis not present

## 2017-05-31 DIAGNOSIS — E7800 Pure hypercholesterolemia, unspecified: Secondary | ICD-10-CM | POA: Diagnosis not present

## 2017-06-07 DIAGNOSIS — E119 Type 2 diabetes mellitus without complications: Secondary | ICD-10-CM | POA: Diagnosis not present

## 2017-06-07 DIAGNOSIS — K219 Gastro-esophageal reflux disease without esophagitis: Secondary | ICD-10-CM | POA: Diagnosis not present

## 2017-06-07 DIAGNOSIS — M542 Cervicalgia: Secondary | ICD-10-CM | POA: Diagnosis not present

## 2017-06-07 DIAGNOSIS — E7800 Pure hypercholesterolemia, unspecified: Secondary | ICD-10-CM | POA: Diagnosis not present

## 2017-06-07 DIAGNOSIS — I10 Essential (primary) hypertension: Secondary | ICD-10-CM | POA: Diagnosis not present

## 2017-06-22 DIAGNOSIS — M5416 Radiculopathy, lumbar region: Secondary | ICD-10-CM | POA: Diagnosis not present

## 2017-06-22 DIAGNOSIS — M48062 Spinal stenosis, lumbar region with neurogenic claudication: Secondary | ICD-10-CM | POA: Diagnosis not present

## 2017-06-22 DIAGNOSIS — M5136 Other intervertebral disc degeneration, lumbar region: Secondary | ICD-10-CM | POA: Diagnosis not present

## 2017-06-28 ENCOUNTER — Encounter: Payer: Self-pay | Admitting: Physical Therapy

## 2017-06-28 ENCOUNTER — Ambulatory Visit: Payer: PPO | Attending: Physical Medicine and Rehabilitation | Admitting: Physical Therapy

## 2017-06-28 DIAGNOSIS — R262 Difficulty in walking, not elsewhere classified: Secondary | ICD-10-CM | POA: Diagnosis not present

## 2017-06-28 DIAGNOSIS — M6281 Muscle weakness (generalized): Secondary | ICD-10-CM

## 2017-06-28 DIAGNOSIS — M5440 Lumbago with sciatica, unspecified side: Secondary | ICD-10-CM

## 2017-06-29 NOTE — Therapy (Signed)
[facility] [address], [place], [address] Phone: [phone]   Fax:  [phone]  Physical Therapy Evaluation  Patient Details  Name: Heather Miranda MRN: [id number] Date of Birth: Sep 14, 1929 Referring Provider: Sabas Sous DO  Encounter Date: 06/28/2017      PT End of Session - 06/28/17 1545    Visit Number 1   Number of Visits 12   Date for PT Re-Evaluation 08/09/17   Authorization Type 1   Authorization Time Period 10 (G code)   PT Start Time 1450   PT Stop Time 1543   PT Time Calculation (min) 53 min   Activity Tolerance Patient tolerated treatment well   Behavior During Therapy [facility] for tasks assessed/performed      Past Medical History:  Diagnosis Date  . Arthritis     Past Surgical History:  Procedure Laterality Date  . KNEE SURGERY     right  . WRIST SURGERY     right    There were no vitals filed for this visit.       Subjective Assessment - 06/28/17 1506    Subjective Patient reports she is doing well with sitting and worse with walking   Pertinent History Patient reports h/o pain beginning in lower back 09/2016 for no apparent reason. She volunteers at hospice and lifting boxes, getting ready to move.   Limitations Standing;Walking;House hold activities   How long can you sit comfortably? in recliner no pain, no pain with sitting   How long can you stand comfortably? <10 min.   How long can you walk comfortably? 15 min. and that's with UE support with grocery cart or other AD   Diagnostic tests MRI spine: see imaging results for impression details: Severe spinal stenosis L4-5 with severe facet degeneration; Marked left foraminal and subarticular stenosis at L2-3. multiple levels of degenerative changes; Moderately large extruded disc fragment on the right at T12-L1 with   Patient Stated Goals to decrease her back pain and be stronger for standing and walking  activities   Currently in Pain? Other (Comment)  no pain in back with sitting. increased pain with standing  Pain ranges from 0/10 at rest up to 8/10 with standing, walking            [facility] PT Assessment - 06/28/17 1514      Assessment   Medical Diagnosis lumbar stenosis with neurogenic claudication, DDD, lumbar radiculitis   Referring Provider Chasnis, Juanda Crumble DO   Onset Date/Surgical Date 09/27/16   Hand Dominance Right   Next MD Visit unknown   Prior Therapy none for back     Precautions   Precautions None     Balance Screen   Has the patient fallen in the past 6 months Yes   How many times? 3  1x outdoors, tripped on curb and was reaching for support   Has the patient had a decrease in activity level because of a fear of falling?  Yes  has decreased working at [employer]   Is the patient reluctant to leave their home because of a fear of falling?  No     Home Environment   Living Environment Private residence   Living Arrangements Alone   Type of [place] to enter   Entrance Stairs-Number of Steps [place] One level   [place] - standard;Cane -  single point;Wheelchair - manual     Prior Function   Level of Independence Independent;Independent with household mobility without device;Independent with community mobility with device   Vocation Works at home   Leisure volunteering with [employer], hospice     Cognition   Overall Cognitive Status Within Functional Limits for tasks assessed     Observation/Other Assessments   Modified [place] 44%     Sensation   Light Touch Appears Intact     Posture/Postural Control   Posture Comments increased thoracic kyphosis, rounded shoulders with forward head posture     ROM / Strength   AROM / PROM / Strength AROM;Strength     AROM   Overall AROM  Within functional limits for tasks performed     Strength   Overall Strength Comments  LE's: right and left LE: hip flexion 4-/5, abduction 4-/5, knee extension 4-/5, flexion right 4-/5, left 4/5, ankle DF bilaterally 4/5     Transfers   Five time sit to stand comments  13 seconds     Ambulation/Gait   Assistive device Straight cane   Gait Pattern Decreased arm swing - right;Decreased arm swing - left;Decreased weight shift to right   Ambulation Surface Level       Objective measurements completed on examination: See above findings.          PT Education - 06/28/17 1530    Education provided Yes   Education Details POC; home program: hip adduction with glute sets, hip abduction with resistive band, roll ball underfoot   Person(s) Educated Patient   Methods Explanation;Demonstration;Tactile cues;Verbal cues;Handout   Comprehension Verbalized understanding;Returned demonstration;Verbal cues required             PT Long Term Goals - 06/28/17 1551      PT LONG TERM GOAL #1   Title Patient will demonstrate improved function with daily tasks and decreased back pain as indicated by MODI score of 20% by 08/09/2017   Baseline MODI 44% (0 = no self perceived disabiltiy)   Status New     PT LONG TERM GOAL #2   Title Patient will demonstrate improved posture awareness and pain control strategies to allow patient to stand and walk with minimal difficulty and pain for >30 min. By 08/09/2017    Baseline unable to stand/walk for >10 min. without increased paiin to 8/10   Status New     PT LONG TERM GOAL #3   Title Patient will be independent with home program for posture awareness, pain control, progressive exercises to allow patient to transition to self management once discharged from physical therapy by 08/09/2017   Baseline limited knowledge of appropriate exercises or progression without guidance, instruction    Status New                Plan - 06/28/17 1545    Clinical Impression Statement Patient is an 83 year old female who presents with back pain and  LE pain and weakness with impariment level of 50% based on strength deficits, MODI score and pain. She has goal of being able to decrease pain in order to be able to walk with less difficulty and be able to perform daily tasks with less difficulty. she has limited knowledge of appropriate pain control strategies, progression of appropriate exercises in order to improve function and will benefit from physical therapy intervention.   History and Personal Factors relevant to plan of care: history of back pain with exacerbation of symptoms 09/2016 with worsening  symptoms radiating into right LE with numbness;    Clinical Presentation Evolving   Clinical Presentation due to: worsening symptoms with radiating pain into right LE and more difficulty with standing and walking   Clinical Decision Making Moderate   Rehab Potential Fair   Clinical Impairments Affecting Rehab Potential (+)motivated(-)age, chronic condition, multiple comorbidities   PT Frequency 2x / week   PT Duration 6 weeks   PT Treatment/Interventions Electrical Stimulation;Moist Heat;Patient/family education;Therapeutic exercise;Manual techniques   PT Next Visit Plan manual therapy, therapeutic exercises   PT Home Exercise Plan exercises for stabilization, ROM   Consulted and Agree with Plan of Care Patient      Patient will benefit from skilled therapeutic intervention in order to improve the following deficits and impairments:  Decreased mobility, Pain, Increased muscle spasms, Decreased endurance, Difficulty walking  Visit Diagnosis: Low back pain with sciatica, sciatica laterality unspecified, unspecified back pain laterality, unspecified chronicity - Plan: PT plan of care cert/re-cert  Difficulty in walking, not elsewhere classified - Plan: PT plan of care cert/re-cert  Muscle weakness (generalized) - Plan: PT plan of care cert/re-cert      G-Codes - 71/16/57 1545    Functional Assessment Tool Used (Outpatient Only) MODI,  ROM, strength, pain, clinical judgment   Functional Limitation Mobility: Walking and moving around   Mobility: Walking and Moving Around Current Status ([id number]) At least 40 percent but less than 60 percent impaired, limited or restricted   Mobility: Walking and Moving Around Goal Status [phone]) At least 20 percent but less than 40 percent impaired, limited or restricted       Problem List Patient Active Problem List   Diagnosis Date Noted  . Low back pain 05/07/2017  . Carotid stenosis 05/07/2017    Jomarie Longs PT 06/29/2017, 10:58 PM  [facility] [address] [place], [address] Phone: [phone]   Fax:  [phone]  Name: DJENEBA BARSCH MRN: [id number] Date of Birth: 03-Sep-1929

## 2017-07-01 ENCOUNTER — Ambulatory Visit: Payer: PPO | Admitting: Physical Therapy

## 2017-07-01 ENCOUNTER — Encounter: Payer: Self-pay | Admitting: Physical Therapy

## 2017-07-01 DIAGNOSIS — M5440 Lumbago with sciatica, unspecified side: Secondary | ICD-10-CM | POA: Diagnosis not present

## 2017-07-01 DIAGNOSIS — M6281 Muscle weakness (generalized): Secondary | ICD-10-CM

## 2017-07-01 DIAGNOSIS — R262 Difficulty in walking, not elsewhere classified: Secondary | ICD-10-CM

## 2017-07-01 NOTE — Therapy (Signed)
[facility] [address] [place], [address] Phone: [phone]   Fax:  [phone]  Physical Therapy Treatment  Patient Details  Name: Heather Miranda MRN: [id number] Date of Birth: 1929-06-08 Referring Provider: Sabas Sous DO  Encounter Date: 07/01/2017      PT End of Session - 07/01/17 1123    Visit Number 2   Number of Visits 12   Date for PT Re-Evaluation 08/09/17   Authorization Type 2   Authorization Time Period 10 (G code)   PT Start Time 1120   PT Stop Time 1205   PT Time Calculation (min) 45 min   Activity Tolerance Patient tolerated treatment well   Behavior During Therapy [facility] for tasks assessed/performed      Past Medical History:  Diagnosis Date  . Arthritis     Past Surgical History:  Procedure Laterality Date  . KNEE SURGERY     right  . WRIST SURGERY     right    There were no vitals filed for this visit.      Subjective Assessment - 07/01/17 1121    Subjective Patient reports she is still unable to walk long without pain, numbness in right lower leg. She is exercising as instructed at home.   Pertinent History Patient reports h/o pain beginning in lower back 09/2016 for no apparent reason. She volunteers at hospice and lifting boxes, getting ready to move.   Limitations Standing;Walking;House hold activities   How long can you sit comfortably? in recliner no pain, no pain with sitting   How long can you stand comfortably? <10 min.   How long can you walk comfortably? 15 min. and that's with UE support with grocery cart or other AD   Diagnostic tests MRI spine: see imaging results for impression details: Severe spinal stenosis L4-5 with severe facet degeneration; Marked left foraminal and subarticular stenosis at L2-3. multiple levels of degenerative changes; Moderately large extruded disc fragment on the right at T12-L1 with   Patient Stated Goals to decrease her  back pain and be stronger for standing and walking activities   Currently in Pain? No/denies  pain with walking, standing, none sitting       Objective: Posture: increased thoracic kyphosis, rounded shoulders  Treatment: Therapeutic exercise: patient performed exercises with verbal, tactile cues and demonstration of therapist: Sitting: Hip adduction with ball and glute sets x 10 reps  Hip abduction with manual resistance mild to moderate x 10 reps, 3 second holds Knee flexion with yellow, resistive band x 15 reps Resistive band scapular rows, yellow, x 15 reps Straight arm pull downs with yellow resistive band x 15 palloff press forward with yellow resistive band x 15 reps  Modalities: Electrical stimulation: High volt estim.clincial program for muscle spasms  (2) electrodes applied to lower back lumbar spine paraspinal muscle intensity to tolerance with patient in sitting position with moist heat(unbilled) applied to same x 15 min. goal: pain, spasms; no adverse reactions noted  Patient response to treatment: Patient demonstrated improved technique with exercises with minimal demonstration, VC for correct alignment. Patient without increased pain in back or right LE throughout session.           PT Education - 07/01/17 1151    Education provided Yes   Education Details HEP: re assessed exercises and added UE exercises, scapular retraction, straight arm pull down, palloff press with yellow resistive band   Person(s) Educated Patient   Methods  Explanation;Demonstration;Verbal cues;Handout   Comprehension Verbalized understanding;Returned demonstration;Verbal cues required             PT Long Term Goals - 06/28/17 1551      PT LONG TERM GOAL #1   Title Patient will demonstrate improved function with daily tasks and decreased back pain as indicated by MODI score of 20% by 08/09/2017   Baseline MODI 44% (0 = no self perceived disabiltiy)   Status New     PT LONG TERM  GOAL #2   Title Patient will demonstrate improved posture awareness and pain control strategies to allow patient to stand and walk with minimal difficulty and pain for >30 min. By 08/09/2017    Baseline unable to stand/walk for >10 min. without increased paiin to 8/10   Status New     PT LONG TERM GOAL #3   Title Patient will be independent with home program for posture awareness, pain control, progressive exercises to allow patient to transition to self management once discharged from physical therapy by 08/09/2017   Baseline limited knowledge of appropriate exercises or progression without guidance, instruction    Status New               Plan - 07/01/17 1213    Clinical Impression Statement Patient demosntrated improved technique with exercises following instruction, and with VC. She continues with pain in lower back and general decreased strength in core and will benefit from continued physical therapy intervention to achieve goals.    Rehab Potential Fair   Clinical Impairments Affecting Rehab Potential (+)motivated(-)age, chronic condition, multiple comorbidities   PT Frequency 2x / week   PT Duration 6 weeks   PT Treatment/Interventions Electrical Stimulation;Moist Heat;Patient/family education;Therapeutic exercise;Manual techniques   PT Next Visit Plan manual therapy, therapeutic exercises   PT Home Exercise Plan exercises for stabilization, ROM      Patient will benefit from skilled therapeutic intervention in order to improve the following deficits and impairments:  Decreased mobility, Pain, Increased muscle spasms, Decreased endurance, Difficulty walking  Visit Diagnosis: Low back pain with sciatica, sciatica laterality unspecified, unspecified back pain laterality, unspecified chronicity  Difficulty in walking, not elsewhere classified  Muscle weakness (generalized)     Problem List Patient Active Problem List   Diagnosis Date Noted  . Low back pain 05/07/2017   . Carotid stenosis 05/07/2017    Jomarie Longs PT 07/02/2017, 7:19 AM  [facility] [address], [place], [address] Phone: [phone]   Fax:  [phone]  Name: AIDYN SPORTSMAN MRN: [id number] Date of Birth: 10-May-1929

## 2017-07-05 ENCOUNTER — Encounter: Payer: Self-pay | Admitting: Physical Therapy

## 2017-07-05 ENCOUNTER — Ambulatory Visit: Payer: PPO | Admitting: Physical Therapy

## 2017-07-05 DIAGNOSIS — M5440 Lumbago with sciatica, unspecified side: Secondary | ICD-10-CM | POA: Diagnosis not present

## 2017-07-05 DIAGNOSIS — R262 Difficulty in walking, not elsewhere classified: Secondary | ICD-10-CM

## 2017-07-05 DIAGNOSIS — M6281 Muscle weakness (generalized): Secondary | ICD-10-CM

## 2017-07-05 NOTE — Therapy (Signed)
[facility] [address], [place], [address] Phone: [phone]   Fax:  [phone]  Physical Therapy Treatment  Patient Details  Name: Heather Miranda MRN: [id number] Date of Birth: Jan 27, 1929 Referring Provider: Sabas Sous DO  Encounter Date: 07/05/2017      PT End of Session - 07/05/17 1044    Visit Number 3   Number of Visits 12   Date for PT Re-Evaluation 08/09/17   Authorization Type 3   Authorization Time Period 10 (G code)   PT Start Time 1016   PT Stop Time 1058   PT Time Calculation (min) 42 min   Activity Tolerance Patient tolerated treatment well   Behavior During Therapy [facility] for tasks assessed/performed      Past Medical History:  Diagnosis Date  . Arthritis     Past Surgical History:  Procedure Laterality Date  . KNEE SURGERY     right  . WRIST SURGERY     right    There were no vitals filed for this visit.      Subjective Assessment - 07/05/17 1018    Subjective Patient reports she had hours of relief of symptoms following last session with electrical stimulation. no numbness following last session even with volunteer hours at [employer]   Pertinent History Patient reports h/o pain beginning in lower back 09/2016 for no apparent reason. She volunteers at hospice and lifting boxes, getting ready to move.   Limitations Standing;Walking;House hold activities   How long can you sit comfortably? in recliner no pain, no pain with sitting   How long can you stand comfortably? <10 min.   How long can you walk comfortably? 15 min. and that's with UE support with grocery cart or other AD   Diagnostic tests MRI spine: see imaging results for impression details: Severe spinal stenosis L4-5 with severe facet degeneration; Marked left foraminal and subarticular stenosis at L2-3. multiple levels of degenerative changes; Moderately large extruded disc fragment on the right  at T12-L1 with   Patient Stated Goals to decrease her back pain and be stronger for standing and walking activities   Currently in Pain? No/denies  no pain with sitting, yes with standing, walking         Objective: Posture: increased thoracic kyphosis, rounded shoulders Gait: forward flexed posture, using SPC for balance  Treatment: Therapeutic exercise: patient performed exercises with verbal, tactile cues and demonstration of therapist: Sitting: Hip adduction with ball and glute sets x 10 reps  Hip abduction with manual resistance mild to moderate x 10 reps, 3 second holds Rhythmic stabilization in sitting with manual resistance of therapist, light to moderate, x 10 reps Standing: On balance beam with UE support on counter; side stepping x 2 min., step ups leading with each LE x 10 with demonstration and VC  Modalities: Electrical stimulation: High volt estim.clincial program for muscle spasms  (2) electrodes applied to lower back lumbar spine paraspinal muscle intensity to tolerance with patient in sitting position with moist heat(unbilled) applied to same x 20 min. goal: pain, spasms; no adverse reactions noted  Patient response to treatment: Patient demonstrated improved technique and posture with minimal to moderate VC and demonstration. Mild fatigue noted with standing exercises and mild increased pain with rhythmic stabilization for trunk flexion.        PT Education - 07/05/17 1040    Education provided Yes   Education Details re assessed home program: instruction  for correct technique and positioning   Person(s) Educated Patient   Methods Explanation;Demonstration;Verbal cues   Comprehension Verbalized understanding;Returned demonstration;Verbal cues required             PT Long Term Goals - 06/28/17 1551      PT LONG TERM GOAL #1   Title Patient will demonstrate improved function with daily tasks and decreased back pain as indicated by MODI score of 20%  by 08/09/2017   Baseline MODI 44% (0 = no self perceived disabiltiy)   Status New     PT LONG TERM GOAL #2   Title Patient will demonstrate improved posture awareness and pain control strategies to allow patient to stand and walk with minimal difficulty and pain for >30 min. By 08/09/2017    Baseline unable to stand/walk for >10 min. without increased paiin to 8/10   Status New     PT LONG TERM GOAL #3   Title Patient will be independent with home program for posture awareness, pain control, progressive exercises to allow patient to transition to self management once discharged from physical therapy by 08/09/2017   Baseline limited knowledge of appropriate exercises or progression without guidance, instruction    Status New               Plan - 07/05/17 1045    Clinical Impression Statement Patient demonstrated good carry over with hours of relief of symptoms and no numbness into right LE with standing/walking. She continues with limitations in strength, endurance and pain and will benefit from continued physical therapy intervention.    Rehab Potential Fair   Clinical Impairments Affecting Rehab Potential (+)motivated(-)age, chronic condition, multiple comorbidities   PT Frequency 2x / week   PT Duration 6 weeks   PT Treatment/Interventions Electrical Stimulation;Moist Heat;Patient/family education;Therapeutic exercise;Manual techniques   PT Next Visit Plan manual therapy, therapeutic exercises, pain control modalities   PT Home Exercise Plan exercises for stabilization, ROM      Patient will benefit from skilled therapeutic intervention in order to improve the following deficits and impairments:  Decreased mobility, Pain, Increased muscle spasms, Decreased endurance, Difficulty walking  Visit Diagnosis: Low back pain with sciatica, sciatica laterality unspecified, unspecified back pain laterality, unspecified chronicity  Difficulty in walking, not elsewhere classified  Muscle  weakness (generalized)     Problem List Patient Active Problem List   Diagnosis Date Noted  . Low back pain 05/07/2017  . Carotid stenosis 05/07/2017    Jomarie Longs PT 07/05/2017, 3:25 PM  [facility] [address], [place], [address] Phone: [phone]   Fax:  [phone]  Name: CHARMIN AGUINIGA MRN: [id number] Date of Birth: 01/30/1929

## 2017-07-06 ENCOUNTER — Encounter: Payer: PPO | Admitting: Physical Therapy

## 2017-07-07 ENCOUNTER — Encounter: Payer: PPO | Admitting: Physical Therapy

## 2017-07-08 ENCOUNTER — Ambulatory Visit: Payer: PPO | Admitting: Physical Therapy

## 2017-07-08 ENCOUNTER — Encounter: Payer: Self-pay | Admitting: Physical Therapy

## 2017-07-08 DIAGNOSIS — M6281 Muscle weakness (generalized): Secondary | ICD-10-CM

## 2017-07-08 DIAGNOSIS — R262 Difficulty in walking, not elsewhere classified: Secondary | ICD-10-CM

## 2017-07-08 DIAGNOSIS — M5440 Lumbago with sciatica, unspecified side: Secondary | ICD-10-CM | POA: Diagnosis not present

## 2017-07-08 NOTE — Therapy (Signed)
[facility] [address] [place], [address] Phone: [phone]   Fax:  [phone]  Physical Therapy Treatment  Patient Details  Name: Heather Miranda MRN: [id number] Date of Birth: 1929-08-02 Referring Provider: Sabas Sous DO  Encounter Date: 07/08/2017      PT End of Session - 07/08/17 1335    Visit Number 4   Number of Visits 12   Date for PT Re-Evaluation 08/09/17   Authorization Type 4   Authorization Time Period 10 (G code)   PT Start Time 1258   PT Stop Time 1340   PT Time Calculation (min) 42 min   Activity Tolerance Patient tolerated treatment well   Behavior During Therapy [facility] for tasks assessed/performed      Past Medical History:  Diagnosis Date  . Arthritis     Past Surgical History:  Procedure Laterality Date  . KNEE SURGERY     right  . WRIST SURGERY     right    There were no vitals filed for this visit.      Subjective Assessment - 07/08/17 1300    Subjective Patient reports she has not had any numbness in her right leg since previous session and the back pain relief is temporary. Patient reports that the heat and electrical stimulation seemed to help a lot with pain control   Pertinent History Patient reports h/o pain beginning in lower back 09/2016 for no apparent reason. She volunteers at hospice and lifting boxes, getting ready to move.   Limitations Standing;Walking;House hold activities   How long can you sit comfortably? in recliner no pain, no pain with sitting   How long can you stand comfortably? <10 min.   How long can you walk comfortably? 15 min. and that's with UE support with grocery cart or other AD   Diagnostic tests MRI spine: see imaging results for impression details: Severe spinal stenosis L4-5 with severe facet degeneration; Marked left foraminal and subarticular stenosis at L2-3. multiple levels of degenerative changes; Moderately large  extruded disc fragment on the right at T12-L1 with   Patient Stated Goals to decrease her back pain and be stronger for standing and walking activities   Currently in Pain? Other (Comment)  no pain in buttocks today just lower back none with sitting        Objective: Posture: increased thoracic kyphosis, rounded shoulders Gait: forward flexed posture, using SPC for balance  Treatment: Therapeutic exercise: patient performed exercises with verbal, tactile cues and demonstration of therapist: goal: improve strength, pain, independent with home program Rhythmic stabilization in sitting with manual resistance of therapist, light to moderate, x 10 reps Standing: On balance beam with UE support on counter; side stepping x 1 1/2 min., step ups leading with each LE x 10 with demonstration and VC with moderate fatigue reported  Modalities: Electrical stimulation: High volt estim.clincial program for muscle spasms (4) electrodes applied to lower back lumbar spine bilateral paraspinal muscles,intensity to tolerance with patient in sittingposition with moist heat(unbilled) applied to same x 20 min.goal: pain, spasms; no adverse reactions noted  Patient response to treatment: Patient demonstrated fatigue with standing exercises and required minimal assistance, cuing to perform all exercises with correct technique. Decreased soreness and improved ability to ambulate with less soreness at end of session.           PT Education - 07/08/17 1400    Education provided Yes   Education Details HEP re  assessed and modified as needed   Person(s) Educated Patient   Methods Explanation;Demonstration;Verbal cues   Comprehension Verbalized understanding;Returned demonstration;Verbal cues required             PT Long Term Goals - 06/28/17 1551      PT LONG TERM GOAL #1   Title Patient will demonstrate improved function with daily tasks and decreased back pain as indicated by MODI score of 20%  by 08/09/2017   Baseline MODI 44% (0 = no self perceived disabiltiy)   Status New     PT LONG TERM GOAL #2   Title Patient will demonstrate improved posture awareness and pain control strategies to allow patient to stand and walk with minimal difficulty and pain for >30 min. By 08/09/2017    Baseline unable to stand/walk for >10 min. without increased paiin to 8/10   Status New     PT LONG TERM GOAL #3   Title Patient will be independent with home program for posture awareness, pain control, progressive exercises to allow patient to transition to self management once discharged from physical therapy by 08/09/2017   Baseline limited knowledge of appropriate exercises or progression without guidance, instruction    Status New               Plan - 07/08/17 1336    Clinical Impression Statement Patient demonstrates steady progress towards goals with improvement noted in strength, continues with decreased endurance. Patient will benefit from continued physical therapy intervention to address limitations and achieve goals.    Rehab Potential Fair   Clinical Impairments Affecting Rehab Potential (+)motivated(-)age, chronic condition, multiple comorbidities   PT Frequency 2x / week   PT Duration 6 weeks   PT Treatment/Interventions Electrical Stimulation;Moist Heat;Patient/family education;Therapeutic exercise;Manual techniques   PT Next Visit Plan manual therapy, therapeutic exercises, pain control modalities   PT Home Exercise Plan exercises for stabilization, ROM      Patient will benefit from skilled therapeutic intervention in order to improve the following deficits and impairments:  Decreased mobility, Pain, Increased muscle spasms, Decreased endurance, Difficulty walking  Visit Diagnosis: Low back pain with sciatica, sciatica laterality unspecified, unspecified back pain laterality, unspecified chronicity  Difficulty in walking, not elsewhere classified  Muscle weakness  (generalized)     Problem List Patient Active Problem List   Diagnosis Date Noted  . Low back pain 05/07/2017  . Carotid stenosis 05/07/2017    Jomarie Longs PT 07/09/2017, 9:34 AM  [facility] [address] [place], [address] Phone: [phone]   Fax:  [phone]  Name: CLESSIE KARRAS MRN: [id number] Date of Birth: Apr 18, 1929

## 2017-07-12 ENCOUNTER — Ambulatory Visit: Payer: PPO | Admitting: Physical Therapy

## 2017-07-13 ENCOUNTER — Ambulatory Visit: Payer: PPO | Admitting: Physical Therapy

## 2017-07-13 ENCOUNTER — Encounter: Payer: Self-pay | Admitting: Physical Therapy

## 2017-07-13 DIAGNOSIS — M6281 Muscle weakness (generalized): Secondary | ICD-10-CM

## 2017-07-13 DIAGNOSIS — M5440 Lumbago with sciatica, unspecified side: Secondary | ICD-10-CM

## 2017-07-13 DIAGNOSIS — R262 Difficulty in walking, not elsewhere classified: Secondary | ICD-10-CM

## 2017-07-14 NOTE — Therapy (Signed)
[facility] [address] [place], [address] Phone: [phone]   Fax:  [phone]  Physical Therapy Treatment  Patient Details  Name: Heather Miranda MRN: [id number] Date of Birth: June 16, 1929 Referring Provider: Sabas Sous DO  Encounter Date: 07/13/2017      PT End of Session - 07/13/17 1311    Visit Number 5   Number of Visits 12   Date for PT Re-Evaluation 08/09/17   Authorization Type 5   Authorization Time Period 10 (G code)   PT Start Time 1301   PT Stop Time 1340   PT Time Calculation (min) 39 min   Activity Tolerance Patient tolerated treatment well   Behavior During Therapy [facility] for tasks assessed/performed      Past Medical History:  Diagnosis Date  . Arthritis     Past Surgical History:  Procedure Laterality Date  . KNEE SURGERY     right  . WRIST SURGERY     right    There were no vitals filed for this visit.      Subjective Assessment - 07/13/17 1307    Subjective Patient reports she has had numbness into right leg a couple of times since last session and she had a few hours of relief of symptoms from treatment.    Pertinent History Patient reports h/o pain beginning in lower back 09/2016 for no apparent reason. She volunteers at hospice and lifting boxes, getting ready to move.   Limitations Standing;Walking;House hold activities   How long can you sit comfortably? in recliner no pain, no pain with sitting   How long can you stand comfortably? <10 min.   How long can you walk comfortably? 15 min. and that's with UE support with grocery cart or other AD   Diagnostic tests MRI spine: see imaging results for impression details: Severe spinal stenosis L4-5 with severe facet degeneration; Marked left foraminal and subarticular stenosis at L2-3. multiple levels of degenerative changes; Moderately large extruded disc fragment on the right at T12-L1 with   Patient Stated  Goals to decrease her back pain and be stronger for standing and walking activities   Currently in Pain? Other (Comment)  no pain with sitting, hurts under buttocks on left today with standing and walking      Objective: Posture: increased thoracic kyphosis, rounded shoulders Gait: forward flexed posture, using SPC for balance  Treatment: Therapeutic exercise: patient performed exercises with verbal, tactile cues and demonstration of therapist: goal: improve strength, pain, independent with home program Standing: On balance beam with UE support on counter and close supervision for safety; side stepping with green resistive band around thighs x 1 min., step ups leading with each LE x 10 with demonstration and VC with moderate fatigue reported Sitting: With red resistive band:  Scapular retraction x 15 Singe arm row x 15 each UE Straight arm pull downs to knees x 15 reps Modified Palloff press x 15 reps Hip adduction with ball and glute sets x 10, 3 second holds Hip abduction with green resistive band x 15 reps  Modalities: Electrical stimulation: High volt estim.clincial program for muscle spasms (4) electrodes applied to lower back lumbar spine bilateral paraspinal muscles,intensity to tolerance with patient in sittingposition with moist heat(unbilled) applied to same x 57min.goal: pain, spasms; no adverse reactions noted  Patient response to treatment: Patient demonstrated increased pain in lower back and LE's with standing exercises. She required moderate VC and repeated instruction to  perform exercises with correct alignment, technique. Patient reported no pain at end of session following estim. And was able to stand and ambulation without pain.            PT Education - 07/13/17 1327    Education provided Yes   Education Details re assessed home exercises with instruction to corrrect technique and alignment   Person(s) Educated Patient   Methods  Explanation;Demonstration;Verbal cues   Comprehension Verbalized understanding;Returned demonstration;Verbal cues required             PT Long Term Goals - 06/28/17 1551      PT LONG TERM GOAL #1   Title Patient will demonstrate improved function with daily tasks and decreased back pain as indicated by MODI score of 20% by 08/09/2017   Baseline MODI 44% (0 = no self perceived disabiltiy)   Status New     PT LONG TERM GOAL #2   Title Patient will demonstrate improved posture awareness and pain control strategies to allow patient to stand and walk with minimal difficulty and pain for >30 min. By 08/09/2017    Baseline unable to stand/walk for >10 min. without increased paiin to 8/10   Status New     PT LONG TERM GOAL #3   Title Patient will be independent with home program for posture awareness, pain control, progressive exercises to allow patient to transition to self management once discharged from physical therapy by 08/09/2017   Baseline limited knowledge of appropriate exercises or progression without guidance, instruction    Status New               Plan - 07/13/17 1345    Clinical Impression Statement Patient with increased pain today and continues with intermittent numbness into right LE. She is responding well to current treatment for pain control and strengthening and demonstrated moderate fatigue with exercises. She requires minimal VC and demonstration to perform exercise with correct alignment and technique and should continue to improve with additional physical therapy intervention. .    Rehab Potential Fair   Clinical Impairments Affecting Rehab Potential (+)motivated(-)age, chronic condition, multiple comorbidities   PT Frequency 2x / week   PT Duration 6 weeks   PT Treatment/Interventions Electrical Stimulation;Moist Heat;Patient/family education;Therapeutic exercise;Manual techniques   PT Next Visit Plan manual therapy, therapeutic exercises, pain control  modalities   PT Home Exercise Plan exercises for stabilization, ROM      Patient will benefit from skilled therapeutic intervention in order to improve the following deficits and impairments:  Decreased mobility, Pain, Increased muscle spasms, Decreased endurance, Difficulty walking  Visit Diagnosis: Low back pain with sciatica, sciatica laterality unspecified, unspecified back pain laterality, unspecified chronicity  Difficulty in walking, not elsewhere classified  Muscle weakness (generalized)     Problem List Patient Active Problem List   Diagnosis Date Noted  . Low back pain 05/07/2017  . Carotid stenosis 05/07/2017    Jomarie Longs PT 07/14/2017, 9:03 AM  [facility] [address] [place], [address] Phone: [phone]   Fax:  [phone]  Name: LAVONE BARRIENTES MRN: [id number] Date of Birth: 1929/10/02

## 2017-07-15 ENCOUNTER — Encounter: Payer: Self-pay | Admitting: Physical Therapy

## 2017-07-15 ENCOUNTER — Ambulatory Visit: Payer: PPO | Admitting: Physical Therapy

## 2017-07-15 DIAGNOSIS — M5440 Lumbago with sciatica, unspecified side: Secondary | ICD-10-CM | POA: Diagnosis not present

## 2017-07-15 DIAGNOSIS — R262 Difficulty in walking, not elsewhere classified: Secondary | ICD-10-CM

## 2017-07-15 NOTE — Therapy (Signed)
[facility] [address], [place], [address] Phone: [phone]   Fax:  [phone]  Physical Therapy Treatment  Patient Details  Name: Heather Miranda MRN: [id number] Date of Birth: 1929-07-21 Referring Provider: Sabas Sous DO  Encounter Date: 07/15/2017      PT End of Session - 07/15/17 1003    Visit Number 6   Number of Visits 12   Date for PT Re-Evaluation 08/09/17   Authorization Type 6   Authorization Time Period 10 (G code)   PT Start Time [id number]   PT Stop Time 1020   PT Time Calculation (min) 32 min   Activity Tolerance Patient tolerated treatment well;Patient limited by pain   Behavior During Therapy [facility] for tasks assessed/performed      Past Medical History:  Diagnosis Date  . Arthritis     Past Surgical History:  Procedure Laterality Date  . KNEE SURGERY     right  . WRIST SURGERY     right    There were no vitals filed for this visit.      Subjective Assessment - 07/15/17 0956    Subjective Patient reports she has increased pain in lower back right side today that began last night following exercises with resistive band walking.    Pertinent History Patient reports h/o pain beginning in lower back 09/2016 for no apparent reason. She volunteers at hospice and lifting boxes, getting ready to move.   Limitations Standing;Walking;House hold activities   How long can you sit comfortably? in recliner no pain, no pain with sitting   How long can you stand comfortably? <10 min.   How long can you walk comfortably? 15 min. and that's with UE support with grocery cart or other AD   Diagnostic tests MRI spine: see imaging results for impression details: Severe spinal stenosis L4-5 with severe facet degeneration; Marked left foraminal and subarticular stenosis at L2-3. multiple levels of degenerative changes; Moderately large extruded disc fragment on the right at T12-L1 with    Patient Stated Goals to decrease her back pain and be stronger for standing and walking activities   Currently in Pain? Yes   Pain Score 5    Pain Location Back   Pain Orientation Right;Lower   Pain Descriptors / Indicators Constant;Aching   Pain Type Acute pain   Pain Onset Yesterday   Aggravating Factors  walking, standing   Pain Relieving Factors ice, heat, rest     Objective: Gait; antalgic Posture: increased thoracic kyphosis, forward head posture, lateral shift left Palpation: increased tenderness right side lower lumbar spine with mild spasms  Treatment:  Modalities: Electrical stimulation: High volt estim.clincial program for muscle spasms (4) electrodes applied to lower back lumbar spine bilateralparaspinal muscles,intensity to tolerance with patient in sittingposition with moist heat(unbilled) applied to same x 35min.goal: pain, spasms; no adverse reactions noted  Patient response to treatment: Patient reported decreased pain to 0/10 at end of session and able to walk without increased pain in lower back.          PT Education - 07/15/17 1002    Education provided Yes   Education Details instructed in use of heat vs ice for pain control and movement without resistance for the next few days to decrease new pain   Person(s) Educated Patient   Methods Explanation   Comprehension Verbalized understanding             PT Long Term Goals -  06/28/17 1551      PT LONG TERM GOAL #1   Title Patient will demonstrate improved function with daily tasks and decreased back pain as indicated by MODI score of 20% by 08/09/2017   Baseline MODI 44% (0 = no self perceived disabiltiy)   Status New     PT LONG TERM GOAL #2   Title Patient will demonstrate improved posture awareness and pain control strategies to allow patient to stand and walk with minimal difficulty and pain for >30 min. By 08/09/2017    Baseline unable to stand/walk for >10 min. without increased paiin to  8/10   Status New     PT LONG TERM GOAL #3   Title Patient will be independent with home program for posture awareness, pain control, progressive exercises to allow patient to transition to self management once discharged from physical therapy by 08/09/2017   Baseline limited knowledge of appropriate exercises or progression without guidance, instruction    Status New               Plan - 07/15/17 1008    Clinical Impression Statement Patient arrived with increased pain therefore focused session on pain control and decreasing spasms. Plan to resume exercises and continue pain control next session.    Rehab Potential Fair   Clinical Impairments Affecting Rehab Potential (+)motivated(-)age, chronic condition, multiple comorbidities   PT Frequency 2x / week   PT Duration 6 weeks   PT Treatment/Interventions Electrical Stimulation;Moist Heat;Patient/family education;Therapeutic exercise;Manual techniques   PT Next Visit Plan manual therapy, therapeutic exercises, pain control modalities   PT Home Exercise Plan exercises for stabilization, ROM      Patient will benefit from skilled therapeutic intervention in order to improve the following deficits and impairments:  Decreased mobility, Pain, Increased muscle spasms, Decreased endurance, Difficulty walking  Visit Diagnosis: Low back pain with sciatica, sciatica laterality unspecified, unspecified back pain laterality, unspecified chronicity  Difficulty in walking, not elsewhere classified     Problem List Patient Active Problem List   Diagnosis Date Noted  . Low back pain 05/07/2017  . Carotid stenosis 05/07/2017    Jomarie Longs PT 07/15/2017, 10:23 AM  [place] [address] [place], [address] Phone: [phone]   Fax:  [phone]  Name: SUSY PLACZEK MRN: [id number] Date of Birth: 1929/03/29

## 2017-07-20 ENCOUNTER — Encounter: Payer: Self-pay | Admitting: Physical Therapy

## 2017-07-20 ENCOUNTER — Ambulatory Visit: Payer: PPO | Admitting: Physical Therapy

## 2017-07-20 DIAGNOSIS — M6281 Muscle weakness (generalized): Secondary | ICD-10-CM

## 2017-07-20 DIAGNOSIS — M5440 Lumbago with sciatica, unspecified side: Secondary | ICD-10-CM

## 2017-07-20 DIAGNOSIS — R262 Difficulty in walking, not elsewhere classified: Secondary | ICD-10-CM

## 2017-07-20 NOTE — Therapy (Signed)
[facility] [address], [place], [address] Phone: [phone]   Fax:  [phone]  Physical Therapy Treatment  Patient Details  Name: Heather Miranda MRN: [id number] Date of Birth: 1929/11/12 Referring Provider: Sabas Sous DO  Encounter Date: 07/20/2017      PT End of Session - 07/20/17 1052    Visit Number 7   Number of Visits 12   Date for PT Re-Evaluation 08/09/17   Authorization Type 7   Authorization Time Period 10 (G code)   PT Start Time 1038   PT Stop Time 1115   PT Time Calculation (min) 37 min   Activity Tolerance Patient tolerated treatment well;Patient limited by pain   Behavior During Therapy [facility] for tasks assessed/performed      Past Medical History:  Diagnosis Date  . Arthritis     Past Surgical History:  Procedure Laterality Date  . KNEE SURGERY     right  . WRIST SURGERY     right    There were no vitals filed for this visit.      Subjective Assessment - 07/20/17 1040    Subjective Patient reports she is doing better and is not having much pain today. She is exercising at home and feels that the heat with electrical stimulation has been a great deal of help with pain control and requests to continue this and she will continue with home exercises.    Pertinent History Patient reports h/o pain beginning in lower back 09/2016 for no apparent reason. She volunteers at hospice and lifting boxes, getting ready to move.   Limitations Standing;Walking;House hold activities   How long can you sit comfortably? in recliner no pain, no pain with sitting   How long can you stand comfortably? <10 min.   How long can you walk comfortably? 15 min. and that's with UE support with grocery cart or other AD   Diagnostic tests MRI spine: see imaging results for impression details: Severe spinal stenosis L4-5 with severe facet degeneration; Marked left foraminal and subarticular  stenosis at L2-3. multiple levels of degenerative changes; Moderately large extruded disc fragment on the right at T12-L1 with   Patient Stated Goals to decrease her back pain and be stronger for standing and walking activities   Currently in Pain? Yes   Pain Score 2    Pain Location Back   Pain Orientation Lower   Pain Descriptors / Indicators Aching   Pain Type Chronic pain   Pain Onset More than a month ago   Pain Frequency Constant        Objective: Gait; antalgic Posture: increased thoracic kyphosis, forward head posture Palpation: tender with spasm bilateral lower back  Treatment:  Modalities: Electrical stimulation: High volt estim.clincial program for muscle spasms (4) electrodes applied to lower back lumbar spine bilateralparaspinal muscles,intensity to tolerance with patient in sittingposition with moist heat(unbilled) applied to same x 41min.goal: pain, spasms; no adverse reactions noted  Patient response to treatment: Patient reported decreased pain to 0/10 in lower back and able to transfer sit to stand and ambulation without pain at end of session.        PT Education - 07/20/17 1111    Education provided Yes   Education Details reassessed home exercises and pain control strategies for self managment, progress towards goals    Person(s) Educated Patient   Methods Explanation   Comprehension Verbalized understanding  PT Long Term Goals - 06/28/17 1551      PT LONG TERM GOAL #1   Title Patient will demonstrate improved function with daily tasks and decreased back pain as indicated by MODI score of 20% by 08/09/2017   Baseline MODI 44% (0 = no self perceived disabiltiy)   Status New     PT LONG TERM GOAL #2   Title Patient will demonstrate improved posture awareness and pain control strategies to allow patient to stand and walk with minimal difficulty and pain for >30 min. By 08/09/2017    Baseline unable to stand/walk for >10 min.  without increased paiin to 8/10   Status New     PT LONG TERM GOAL #3   Title Patient will be independent with home program for posture awareness, pain control, progressive exercises to allow patient to transition to self management once discharged from physical therapy by 08/09/2017   Baseline limited knowledge of appropriate exercises or progression without guidance, instruction    Status New               Plan - 07/20/17 1054    Clinical Impression Statement Patient is responding well to pain control with estim. and heat and is independent with home program and doing well. She requests pain control for next few sessions and will continue with exercises as instructed.    Rehab Potential Fair   Clinical Impairments Affecting Rehab Potential (+)motivated(-)age, chronic condition, multiple comorbidities   PT Frequency 2x / week   PT Duration 6 weeks   PT Treatment/Interventions Electrical Stimulation;Moist Heat;Patient/family education;Therapeutic exercise;Manual techniques   PT Next Visit Plan manual therapy, therapeutic exercises, pain control modalities   PT Home Exercise Plan exercises for stabilization, ROM      Patient will benefit from skilled therapeutic intervention in order to improve the following deficits and impairments:  Decreased mobility, Pain, Increased muscle spasms, Decreased endurance, Difficulty walking  Visit Diagnosis: Low back pain with sciatica, sciatica laterality unspecified, unspecified back pain laterality, unspecified chronicity  Difficulty in walking, not elsewhere classified  Muscle weakness (generalized)     Problem List Patient Active Problem List   Diagnosis Date Noted  . Low back pain 05/07/2017  . Carotid stenosis 05/07/2017    Jomarie Longs PT 07/21/2017, 7:13 AM  [facility] [address] [place], [address] Phone: [phone]   Fax:  [phone]  Name:  Heather Miranda MRN: [id number] Date of Birth: 1929-04-17

## 2017-07-22 ENCOUNTER — Ambulatory Visit: Payer: PPO | Admitting: Physical Therapy

## 2017-07-22 ENCOUNTER — Encounter: Payer: Self-pay | Admitting: Physical Therapy

## 2017-07-22 DIAGNOSIS — M6281 Muscle weakness (generalized): Secondary | ICD-10-CM

## 2017-07-22 DIAGNOSIS — M5440 Lumbago with sciatica, unspecified side: Secondary | ICD-10-CM | POA: Diagnosis not present

## 2017-07-22 DIAGNOSIS — R262 Difficulty in walking, not elsewhere classified: Secondary | ICD-10-CM

## 2017-07-22 NOTE — Therapy (Signed)
[facility] [address] [place], [address] Phone: [phone]   Fax:  [phone]  Physical Therapy Treatment  Patient Details  Name: Heather Miranda MRN: [id number] Date of Birth: 1929/08/01 Referring Provider: Sabas Sous DO  Encounter Date: 07/22/2017      PT End of Session - 07/22/17 1006    Visit Number 8   Number of Visits 12   Date for PT Re-Evaluation 08/09/17   Authorization Type 8   Authorization Time Period 10 (G code)   PT Start Time [phone]   PT Stop Time 1020   PT Time Calculation (min) 33 min   Activity Tolerance Patient tolerated treatment well;Patient limited by pain   Behavior During Therapy [facility] for tasks assessed/performed      Past Medical History:  Diagnosis Date  . Arthritis     Past Surgical History:  Procedure Laterality Date  . KNEE SURGERY     right  . WRIST SURGERY     right    There were no vitals filed for this visit.      Subjective Assessment - 07/22/17 1002    Subjective Patient reports she is doing better and is not having much pain today.    Pertinent History Patient reports h/o pain beginning in lower back 09/2016 for no apparent reason. She volunteers at hospice and lifting boxes, getting ready to move.   Limitations Standing;Walking;House hold activities   How long can you sit comfortably? in recliner no pain, no pain with sitting   How long can you stand comfortably? <10 min.   How long can you walk comfortably? 15 min. and that's with UE support with grocery cart or other AD   Diagnostic tests MRI spine: see imaging results for impression details: Severe spinal stenosis L4-5 with severe facet degeneration; Marked left foraminal and subarticular stenosis at L2-3. multiple levels of degenerative changes; Moderately large extruded disc fragment on the right at T12-L1 with   Patient Stated Goals to decrease her back pain and be stronger for  standing and walking activities   Currently in Pain? Yes   Pain Score 3    Pain Location Back   Pain Orientation Lower   Pain Descriptors / Indicators Aching   Pain Type Chronic pain   Pain Onset More than a month ago   Pain Frequency Constant       Objective: Posture: increased thoracic kyphosis, forward head posture Palpation: tender to palpation lumbar spine bilaterally  Treatment: Modalities: Electrical stimulation: High volt estim.clincial program for muscle spasms (4) electrodes applied to lower back lumbar spine bilateralparaspinal muscles,intensity to tolerance with patient in sittingposition with moist heat(unbilled) applied to same x 48min.goal: pain, spasms; no adverse reactions noted  Patient response to treatment: Patient reported no pain following treatment and able to stand and walk with no return of pain at end of session.           PT Education - 07/22/17 1004    Education provided Yes   Education Details discussed use of TENS unit    Person(s) Educated Patient   Methods Explanation   Comprehension Verbalized understanding             PT Long Term Goals - 06/28/17 1551      PT LONG TERM GOAL #1   Title Patient will demonstrate improved function with daily tasks and decreased back pain as indicated by MODI score of 20% by 08/09/2017  Baseline MODI 44% (0 = no self perceived disabiltiy)   Status New     PT LONG TERM GOAL #2   Title Patient will demonstrate improved posture awareness and pain control strategies to allow patient to stand and walk with minimal difficulty and pain for >30 min. By 08/09/2017    Baseline unable to stand/walk for >10 min. without increased paiin to 8/10   Status New     PT LONG TERM GOAL #3   Title Patient will be independent with home program for posture awareness, pain control, progressive exercises to allow patient to transition to self management once discharged from physical therapy by 08/09/2017   Baseline  limited knowledge of appropriate exercises or progression without guidance, instruction    Status New               Plan - 07/22/17 1028    Clinical Impression Statement Patient demonstates good carry over with pain control modalities with mild pain on arrival to therapy and no pain following session. She should continue to benefit from additional visits in order to have lasting relief and be able to transition to self management.    Rehab Potential Fair   Clinical Impairments Affecting Rehab Potential (+)motivated(-)age, chronic condition, multiple comorbidities   PT Frequency 2x / week   PT Duration 6 weeks   PT Treatment/Interventions Electrical Stimulation;Moist Heat;Patient/family education;Therapeutic exercise;Manual techniques   PT Next Visit Plan manual therapy, therapeutic exercises, pain control modalities   PT Home Exercise Plan exercises for stabilization, ROM      Patient will benefit from skilled therapeutic intervention in order to improve the following deficits and impairments:  Decreased mobility, Pain, Increased muscle spasms, Decreased endurance, Difficulty walking  Visit Diagnosis: Low back pain with sciatica, sciatica laterality unspecified, unspecified back pain laterality, unspecified chronicity  Difficulty in walking, not elsewhere classified  Muscle weakness (generalized)     Problem List Patient Active Problem List   Diagnosis Date Noted  . Low back pain 05/07/2017  . Carotid stenosis 05/07/2017    Jomarie Longs PT 07/22/2017, 10:30 AM  [facility] [address] [place], [address] Phone: [phone]   Fax:  [phone]  Name: Heather Miranda MRN: [id number] Date of Birth: 1929/06/13

## 2017-07-26 ENCOUNTER — Encounter: Payer: Self-pay | Admitting: Physical Therapy

## 2017-07-26 ENCOUNTER — Ambulatory Visit: Payer: PPO | Admitting: Physical Therapy

## 2017-07-26 DIAGNOSIS — M5440 Lumbago with sciatica, unspecified side: Secondary | ICD-10-CM | POA: Diagnosis not present

## 2017-07-26 DIAGNOSIS — R262 Difficulty in walking, not elsewhere classified: Secondary | ICD-10-CM

## 2017-07-26 DIAGNOSIS — M6281 Muscle weakness (generalized): Secondary | ICD-10-CM

## 2017-07-26 NOTE — Therapy (Signed)
[facility] [facility] [address], [place], [address] Phone: [phone]   Fax:  [phone]  Physical Therapy Treatment  Patient Details  Name: Heather Miranda MRN: [id number] Date of Birth: 07/10/1929 Referring Provider: Sabas Sous DO  Encounter Date: 07/26/2017      PT End of Session - 07/26/17 1156    Visit Number 9   Number of Visits 12   Date for PT Re-Evaluation 08/09/17   Authorization Type 9   Authorization Time Period 10 (G code)   PT Start Time 1150   PT Stop Time 1230   PT Time Calculation (min) 40 min   Activity Tolerance Patient tolerated treatment well;Patient limited by pain   Behavior During Therapy [facility] for tasks assessed/performed      Past Medical History:  Diagnosis Date  . Arthritis     Past Surgical History:  Procedure Laterality Date  . KNEE SURGERY     right  . WRIST SURGERY     right    There were no vitals filed for this visit.      Subjective Assessment - 07/26/17 1154    Subjective Patient reports she is seeing some improvement with back pain. She continued with back pain following previous session about 1-2 hours after treatment.    Pertinent History Patient reports h/o pain beginning in lower back 09/2016 for no apparent reason. She volunteers at hospice and lifting boxes, getting ready to move.   Limitations Standing;Walking;House hold activities   How long can you sit comfortably? in recliner no pain, no pain with sitting   How long can you stand comfortably? <10 min.   How long can you walk comfortably? 15 min. and that's with UE support with grocery cart or other AD   Diagnostic tests MRI spine: see imaging results for impression details: Severe spinal stenosis L4-5 with severe facet degeneration; Marked left foraminal and subarticular stenosis at L2-3. multiple levels of degenerative changes; Moderately large extruded disc fragment on the right at T12-L1  with   Patient Stated Goals to decrease her back pain and be stronger for standing and walking activities   Currently in Pain? Yes   Pain Score 2    Pain Location Back   Pain Orientation Lower   Pain Descriptors / Indicators Aching   Pain Type Chronic pain   Pain Onset More than a month ago   Pain Frequency Constant      Objective: Posture: increased thoracic kyphosis, forward head posture Palpation: tender to palpation lumbar spine bilaterally  Treatment: Modalities: Electrical stimulation: High volt estim.clincial program for muscle spasms (4) electrodes applied to lower back lumbar spine bilateralparaspinal muscles,intensity to tolerance with patient in sittingposition with moist heat(unbilled) applied to same x 31min.goal: pain, spasms; no adverse reactions noted  Patient response to treatment: Patient reported no pain at end of session and verbalized good understanding of home exercise program.          PT Education - 07/26/17 1222    Education provided Yes   Education Details reassessed HEP   Person(s) Educated Patient   Methods Explanation   Comprehension Verbalized understanding             PT Long Term Goals - 06/28/17 1551      PT LONG TERM GOAL #1   Title Patient will demonstrate improved function with daily tasks and decreased back pain as indicated by MODI score of 20% by 08/09/2017   Baseline  MODI 44% (0 = no self perceived disabiltiy)   Status New     PT LONG TERM GOAL #2   Title Patient will demonstrate improved posture awareness and pain control strategies to allow patient to stand and walk with minimal difficulty and pain for >30 min. By 08/09/2017    Baseline unable to stand/walk for >10 min. without increased paiin to 8/10   Status New     PT LONG TERM GOAL #3   Title Patient will be independent with home program for posture awareness, pain control, progressive exercises to allow patient to transition to self management once discharged  from physical therapy by 08/09/2017   Baseline limited knowledge of appropriate exercises or progression without guidance, instruction    Status New               Plan - 07/26/17 1230    Clinical Impression Statement Patient demonstrates improvement with decreasing lower back pain and continues with intermittent exacerbations of symptoms with standing, walking activities. She continues to benefit from therapy for pain control and will plan discharge at end of this week.    Rehab Potential Fair   Clinical Impairments Affecting Rehab Potential (+)motivated(-)age, chronic condition, multiple comorbidities   PT Frequency 2x / week   PT Duration 6 weeks   PT Treatment/Interventions Electrical Stimulation;Moist Heat;Patient/family education;Therapeutic exercise;Manual techniques   PT Next Visit Plan manual therapy, therapeutic exercises, pain control modalities; re assessment and discharge   PT Home Exercise Plan exercises for stabilization, ROM      Patient will benefit from skilled therapeutic intervention in order to improve the following deficits and impairments:  Decreased mobility, Pain, Increased muscle spasms, Decreased endurance, Difficulty walking  Visit Diagnosis: Low back pain with sciatica, sciatica laterality unspecified, unspecified back pain laterality, unspecified chronicity  Difficulty in walking, not elsewhere classified  Muscle weakness (generalized)     Problem List Patient Active Problem List   Diagnosis Date Noted  . Low back pain 05/07/2017  . Carotid stenosis 05/07/2017    Jomarie Longs PT 07/27/2017, 8:50 AM  [place] [address], [place], [address] Phone: [phone]   Fax:  [phone]  Name: Heather Miranda MRN: [id number] Date of Birth: September 05, 1929

## 2017-07-28 ENCOUNTER — Ambulatory Visit: Payer: PPO | Attending: Physical Medicine and Rehabilitation | Admitting: Physical Therapy

## 2017-07-28 ENCOUNTER — Encounter: Payer: Self-pay | Admitting: Physical Therapy

## 2017-07-28 DIAGNOSIS — R262 Difficulty in walking, not elsewhere classified: Secondary | ICD-10-CM

## 2017-07-28 DIAGNOSIS — M6281 Muscle weakness (generalized): Secondary | ICD-10-CM | POA: Insufficient documentation

## 2017-07-28 DIAGNOSIS — M5440 Lumbago with sciatica, unspecified side: Secondary | ICD-10-CM

## 2017-07-29 NOTE — Therapy (Signed)
[place] [facility] [address], [place], [address] Phone: [phone]   Fax:  [phone]  Physical Therapy Treatment/Discharge Summary  Patient Details  Name: Heather Miranda MRN: [id number] Date of Birth: [birth year] Referring Provider: Sabas Sous DO  Encounter Date: 07/28/2017   Patient began physical therapy on 06/28/2017 and has attended 10 sessions through 07/28/2017. She has achieved #3/partially met #1/not met #2 goals and is independent in home program for continued self management of pain/symptoms and exercises as instructed. Plan discharge from physical therapy at this time.        PT End of Session - 07/28/17 1230    Visit Number 10   Number of Visits 12   Date for PT Re-Evaluation 08/09/17   Authorization Type 10   Authorization Time Period 10 (G code)   PT Start Time 1146   PT Stop Time 1225   PT Time Calculation (min) 39 min   Activity Tolerance Patient tolerated treatment well;Patient limited by pain   Behavior During Therapy [facility] for tasks assessed/performed      Past Medical History:  Diagnosis Date  . Arthritis     Past Surgical History:  Procedure Laterality Date  . KNEE SURGERY     right  . WRIST SURGERY     right    There were no vitals filed for this visit.      Subjective Assessment - 07/28/17 1203    Subjective Patient reports she has seen some improvement with physical therapy intervention and is now ready to continue independently with home exercise and self managment.    Pertinent History Patient reports h/o pain beginning in lower back 09/2016 for no apparent reason. She volunteers at hospice and lifting boxes, getting ready to move.   Limitations Standing;Walking;House hold activities   How long can you sit comfortably? in recliner no pain, no pain with sitting   How long can you stand comfortably? <10 min.   How long can you walk comfortably? 15 min. and that's  with UE support with grocery cart or other AD   Diagnostic tests MRI spine: see imaging results for impression details: Severe spinal stenosis L4-5 with severe facet degeneration; Marked left foraminal and subarticular stenosis at L2-3. multiple levels of degenerative changes; Moderately large extruded disc fragment on the right at T12-L1 with   Patient Stated Goals to decrease her back pain and be stronger for standing and walking activities   Currently in Pain? Yes   Pain Score 2   6-7/10 on waking and with self management of ice and hot shower    Pain Location Back   Pain Orientation Lower   Pain Descriptors / Indicators Aching   Pain Type Chronic pain   Pain Onset More than a month ago   Pain Frequency Constant         Objective: Posture: increased thoracic kyphosis, forward head posture Palpation: tender to palpation lumbar spine bilaterally Outcome measure: MODI 36% (initially was 44% demonstrating improvement with physical therapy intervention)  Treatment: Modalities: Electrical stimulation: High volt estim.clincial program for muscle spasms (4) electrodes applied to lower back lumbar spine bilateralparaspinal muscles,intensity to tolerance with patient in sittingposition with moist heat(unbilled) applied to same x 71mn.goal: pain, spasms; no adverse reactions noted  Verbally reassessed exercises to continue at home:  Scapular retraction with resistive band high and low rows and straight arm pull downs LE stabilization with ball and resistive bands in sitting; standing side stepping,  hip abduction, extension  Patient response to treatment: Patient reported improved pain to mild and less tender to palpation lower back at end of session. She verbalized good understanding of home program.          PT Education - 2017/08/01 02/26/1205    Education provided Yes   Education Details reviewed exercises and self management for pain control to continue independently   Person(s)  Educated Patient   Methods Explanation   Comprehension Verbalized understanding             PT Long Term Goals - 08-01-2017 1233      PT LONG TERM GOAL #1   Title Patient will demonstrate improved function with daily tasks and decreased back pain as indicated by MODI score of 20% by 08/09/2017   Baseline MODI 44% (0 = no self perceived disabiltiy); 08-01-17 36%    Status Partially Met     PT LONG TERM GOAL #2   Title Patient will demonstrate improved posture awareness and pain control strategies to allow patient to stand and walk with minimal difficulty and pain for >30 min. By 08/09/2017    Baseline unable to stand/walk for >10 min. without increased paiin to 8/10; pain continues to be exacerbated by walking even short distances and < 10 min. even with UE support    Status Not Met     PT LONG TERM GOAL #3   Title Patient will be independent with home program for posture awareness, pain control, progressive exercises to allow patient to transition to self management once discharged from physical therapy by 08/09/2017   Baseline limited knowledge of appropriate exercises or progression without guidance, instruction ; 2017-08-01 patient is iindependent with home program   Status Achieved               Plan - August 01, 2017 1230    Clinical Impression Statement Patient demonstrates fair improvement with physical therapy intervention and is able to continue with independent self management of symptoms and home exercises. No further therapy is recommended at this time.    Rehab Potential Fair   Clinical Impairments Affecting Rehab Potential (+)motivated(-)age, chronic condition, multiple comorbidities   PT Frequency 2x / week   PT Duration 6 weeks   PT Treatment/Interventions Electrical Stimulation;Moist Heat;Patient/family education;Therapeutic exercise;Manual techniques   PT Next Visit Plan Discharge    PT Home Exercise Plan exercises for stabilization, ROM, posture awareness, pain control    Consulted and Agree with Plan of Care Patient      Patient will benefit from skilled therapeutic intervention in order to improve the following deficits and impairments:  Decreased mobility, Pain, Increased muscle spasms, Decreased endurance, Difficulty walking  Visit Diagnosis: Low back pain with sciatica, sciatica laterality unspecified, unspecified back pain laterality, unspecified chronicity  Difficulty in walking, not elsewhere classified  Muscle weakness (generalized)       G-Codes - 08/01/17 02/27/1216    Functional Assessment Tool Used (Outpatient Only) MODI, ROM, strength, pain, clinical judgment   Functional Limitation Mobility: Walking and moving around   Mobility: Walking and Moving Around Goal Status [phone]) At least 20 percent but less than 40 percent impaired, limited or restricted   Mobility: Walking and Moving Around Discharge Status [phone]) At least 20 percent but less than 40 percent impaired, limited or restricted      Problem List Patient Active Problem List   Diagnosis Date Noted  . Low back pain 05/07/2017  . Carotid stenosis 05/07/2017    Jomarie Longs PT 07/29/2017,  12:19 PM  [place] [address] [place], [address] Phone: [phone]   Fax:  [phone]  Name: Heather Miranda MRN: [id number] Date of Birth: [birth year]

## 2017-08-03 ENCOUNTER — Encounter: Payer: PPO | Admitting: Physical Therapy

## 2017-08-05 ENCOUNTER — Encounter: Payer: PPO | Admitting: Physical Therapy

## 2017-08-10 ENCOUNTER — Encounter: Payer: PPO | Admitting: Physical Therapy

## 2017-08-12 ENCOUNTER — Encounter: Payer: PPO | Admitting: Physical Therapy

## 2017-08-24 DIAGNOSIS — M48062 Spinal stenosis, lumbar region with neurogenic claudication: Secondary | ICD-10-CM | POA: Diagnosis not present

## 2017-08-24 DIAGNOSIS — M5136 Other intervertebral disc degeneration, lumbar region: Secondary | ICD-10-CM | POA: Diagnosis not present

## 2017-08-24 DIAGNOSIS — M5416 Radiculopathy, lumbar region: Secondary | ICD-10-CM | POA: Diagnosis not present

## 2017-09-08 DIAGNOSIS — L578 Other skin changes due to chronic exposure to nonionizing radiation: Secondary | ICD-10-CM | POA: Diagnosis not present

## 2017-09-08 DIAGNOSIS — L812 Freckles: Secondary | ICD-10-CM | POA: Diagnosis not present

## 2017-09-08 DIAGNOSIS — D1800 Hemangioma unspecified site: Secondary | ICD-10-CM | POA: Diagnosis not present

## 2017-09-08 DIAGNOSIS — L821 Other seborrheic keratosis: Secondary | ICD-10-CM | POA: Diagnosis not present

## 2017-09-08 DIAGNOSIS — D229 Melanocytic nevi, unspecified: Secondary | ICD-10-CM | POA: Diagnosis not present

## 2017-09-08 DIAGNOSIS — L820 Inflamed seborrheic keratosis: Secondary | ICD-10-CM | POA: Diagnosis not present

## 2017-09-08 DIAGNOSIS — L570 Actinic keratosis: Secondary | ICD-10-CM | POA: Diagnosis not present

## 2017-09-09 ENCOUNTER — Other Ambulatory Visit: Payer: Self-pay | Admitting: Neurosurgery

## 2017-09-09 DIAGNOSIS — M48062 Spinal stenosis, lumbar region with neurogenic claudication: Secondary | ICD-10-CM

## 2017-09-09 DIAGNOSIS — M5124 Other intervertebral disc displacement, thoracic region: Secondary | ICD-10-CM | POA: Diagnosis not present

## 2017-09-09 DIAGNOSIS — G9519 Other vascular myelopathies: Secondary | ICD-10-CM

## 2017-09-16 ENCOUNTER — Ambulatory Visit
Admission: RE | Admit: 2017-09-16 | Discharge: 2017-09-16 | Disposition: A | Discharge status: Home / Self Care | Payer: PPO | Source: Ambulatory Visit | Attending: Neurosurgery | Admitting: Neurosurgery

## 2017-09-16 DIAGNOSIS — M48062 Spinal stenosis, lumbar region with neurogenic claudication: Secondary | ICD-10-CM | POA: Insufficient documentation

## 2017-09-16 DIAGNOSIS — M5125 Other intervertebral disc displacement, thoracolumbar region: Secondary | ICD-10-CM | POA: Diagnosis not present

## 2017-09-16 DIAGNOSIS — M5136 Other intervertebral disc degeneration, lumbar region: Secondary | ICD-10-CM | POA: Insufficient documentation

## 2017-09-16 DIAGNOSIS — M5124 Other intervertebral disc displacement, thoracic region: Secondary | ICD-10-CM | POA: Diagnosis not present

## 2017-09-16 DIAGNOSIS — G9519 Other vascular myelopathies: Secondary | ICD-10-CM

## 2017-09-21 ENCOUNTER — Ambulatory Visit
Admission: RE | Admit: 2017-09-21 | Discharge: 2017-09-21 | Disposition: A | Discharge status: Home / Self Care | Payer: PPO | Source: Ambulatory Visit | Attending: Neurosurgery | Admitting: Neurosurgery

## 2017-09-21 ENCOUNTER — Encounter
Admission: RE | Admit: 2017-09-21 | Discharge: 2017-09-21 | Disposition: A | Discharge status: Home / Self Care | Payer: PPO | Source: Ambulatory Visit | Attending: Neurosurgery | Admitting: Neurosurgery

## 2017-09-21 DIAGNOSIS — Z01810 Encounter for preprocedural cardiovascular examination: Secondary | ICD-10-CM | POA: Insufficient documentation

## 2017-09-21 DIAGNOSIS — M48062 Spinal stenosis, lumbar region with neurogenic claudication: Secondary | ICD-10-CM | POA: Diagnosis not present

## 2017-09-21 DIAGNOSIS — I700 Atherosclerosis of aorta: Secondary | ICD-10-CM | POA: Insufficient documentation

## 2017-09-21 DIAGNOSIS — I517 Cardiomegaly: Secondary | ICD-10-CM | POA: Diagnosis not present

## 2017-09-21 DIAGNOSIS — Z01818 Encounter for other preprocedural examination: Secondary | ICD-10-CM | POA: Insufficient documentation

## 2017-09-21 DIAGNOSIS — K449 Diaphragmatic hernia without obstruction or gangrene: Secondary | ICD-10-CM | POA: Diagnosis not present

## 2017-09-21 DIAGNOSIS — Z01812 Encounter for preprocedural laboratory examination: Secondary | ICD-10-CM | POA: Insufficient documentation

## 2017-09-21 HISTORY — DX: Gastro-esophageal reflux disease without esophagitis: K21.9

## 2017-09-21 HISTORY — DX: Diaphragmatic hernia without obstruction or gangrene: K44.9

## 2017-09-21 HISTORY — DX: Cardiac arrhythmia, unspecified: I49.9

## 2017-09-21 HISTORY — DX: Other allergic rhinitis: J30.89

## 2017-09-21 LAB — BASIC METABOLIC PANEL
Anion gap: 8 (ref 5–15)
BUN: 16 mg/dL (ref 6–20)
CALCIUM: 9.2 mg/dL (ref 8.9–10.3)
CO2: 25 mmol/L (ref 22–32)
CREATININE: 0.68 mg/dL (ref 0.44–1.00)
Chloride: 97 mmol/L — ABNORMAL LOW (ref 101–111)
GFR calc Af Amer: >60 mL/min (ref >60)
GFR calc non Af Amer: >60 mL/min (ref >60)
Glucose, Bld: 78 mg/dL (ref 65–99)
Potassium: 4.2 mmol/L (ref 3.5–5.1)
Sodium: 130 mmol/L — ABNORMAL LOW (ref 135–145)

## 2017-09-21 LAB — CBC
HEMATOCRIT: 39.2 % (ref 35.0–47.0)
Hemoglobin: 13.3 g/dL (ref 12.0–16.0)
MCH: 31.2 pg (ref 26.0–34.0)
MCHC: 33.9 g/dL (ref 32.0–36.0)
MCV: 91.8 fL (ref 80.0–100.0)
Platelets: 238 10*3/uL (ref 150–440)
RBC: 4.27 MIL/uL (ref 3.80–5.20)
RDW: 14.3 % (ref 11.5–14.5)
WBC: 5.5 10*3/uL (ref 3.6–11.0)

## 2017-09-21 LAB — URINALYSIS, COMPLETE (UACMP) WITH MICROSCOPIC
BACTERIA UA: NONE SEEN
Bilirubin Urine: NEGATIVE
GLUCOSE, UA: NEGATIVE mg/dL
HGB URINE DIPSTICK: NEGATIVE
KETONES UR: NEGATIVE mg/dL
LEUKOCYTES UA: NEGATIVE
NITRITE: NEGATIVE
PH: 6 (ref 5.0–8.0)
PROTEIN: NEGATIVE mg/dL
Specific Gravity, Urine: 1.018 (ref 1.005–1.030)

## 2017-09-21 LAB — TYPE AND SCREEN
ABO/RH(D): O POS
Antibody Screen: NEGATIVE

## 2017-09-21 LAB — SURGICAL PCR SCREEN
MRSA, PCR: NEGATIVE
STAPHYLOCOCCUS AUREUS: POSITIVE — AB

## 2017-09-21 LAB — APTT: aPTT: 33 seconds (ref 24–36)

## 2017-09-21 LAB — PROTIME-INR
INR: 1.05
Prothrombin Time: 13.6 seconds (ref 11.4–15.2)

## 2017-09-21 NOTE — Pre-Procedure Instructions (Signed)
DR Gildardo Griffes COMPARED EKG'S AND OK TO PROCEED.

## 2017-09-21 NOTE — Patient Instructions (Signed)
Your procedure is scheduled on: 09/29/17 Wed Report to Same Day Surgery 2nd floor medical mall [facility] Entrance-take elevator on left to 2nd floor.  Check in with surgery information desk.) To find out your arrival time please call [phone] between 1PM - 3PM on 09/28/17 Tues  Remember: Instructions that are not followed completely may result in serious medical risk, up to and including death, or upon the discretion of your surgeon and anesthesiologist your surgery may need to be rescheduled.    _x___ 1. Do not eat food after midnight the night before your procedure. You may drink clear liquids up to 2 hours before you are scheduled to arrive at the hospital for your procedure.  Do not drink clear liquids within 2 hours of your scheduled arrival to the hospital.  Clear liquids include  --Water or Apple juice without pulp  --Clear carbohydrate beverage such as ClearFast or Gatorade  --Black Coffee or Clear Tea (No milk, no creamers, do not add anything to                  the coffee or Tea Type 1 and type 2 diabetics should only drink water.  No gum chewing or hard candies.     __x__ 2. No Alcohol for 24 hours before or after surgery.   __x__3. No Smoking for 24 prior to surgery.   ____  4. Bring all medications with you on the day of surgery if instructed.    __x__ 5. Notify your doctor if there is any change in your medical condition     (cold, fever, infections).     Do not wear jewelry, make-up, hairpins, clips or nail polish.  Do not wear lotions, powders, or perfumes. You may wear deodorant.  Do not shave 48 hours prior to surgery. Men may shave face and neck.  Do not bring valuables to the hospital.    [facility] is not responsible for any belongings or valuables.               Contacts, dentures or bridgework may not be worn into surgery.  Leave your suitcase in the car. After surgery it may be brought to your room.  For patients admitted to the hospital, discharge  time is determined by your                       treatment team.   Patients discharged the day of surgery will not be allowed to drive home.  You will need someone to drive you home and stay with you the night of your procedure.    Please read over the following fact sheets that you were given:   [facility] Preparing for Surgery and or MRSA Information   _x___ Take anti-hypertensive listed below, cardiac, seizure, asthma,     anti-reflux and psychiatric medicines. These include:  1. atenolol (TENORMIN) 25 MG tablet  2.omeprazole (PRILOSEC) 20 MG capsule  3.  4.  5.  6.  ____Fleets enema or Magnesium Citrate as directed.   _x___ Use CHG Soap or sage wipes as directed on instruction sheet   ____ Use inhalers on the day of surgery and bring to hospital day of surgery  ____ Stop Metformin and Janumet 2 days prior to surgery.    ____ Take 1/2 of usual insulin dose the night before surgery and none on the morning     surgery.   _x___ Follow recommendations from Cardiologist, Pulmonologist or PCP regarding  stopping Aspirin, Coumadin, Plavix ,Eliquis, Effient, or Pradaxa, and Pletal.  Stop Aspirin 1 week before surgery if OK with physician  X____Stop Anti-inflammatories such as Advil, Aleve, Ibuprofen, Motrin, Naproxen, Naprosyn, Goodies powders or aspirin products. OK to take Tylenol and                          Celebrex.   _x___ Stop supplements until after surgery.  But may continue Vitamin D, Vitamin B,       and multivitamin.   Stop fish oil 1 week before surgery  ____ Bring C-Pap to the hospital.

## 2017-09-21 NOTE — Pre-Procedure Instructions (Addendum)
Call Dr Kayleen Memos regarding chest x-ray report.  Medical clearance requested.  Dr Izora Ribas and Dr Doy Hutching office notified of above mentioned. Called patient and appointment made with Dr Doy Hutching for 9/27 @ 8:15 am.

## 2017-09-21 NOTE — Pre-Procedure Instructions (Signed)
History of Present Illness: Heather Miranda is a 83 y.o.female patient who returns for evaluation of chest pain, essential hypertension and hyperlipidemia. Patient seen at [facility] emergency room 03/06/2016 with 2 day history of chest pain. She reports occasional episodes of chest discomfort. She has mild chronic exertional dyspnea. She underwent stress echocardiogram 04/01/2016, exercise 6 minutes and 32 seconds on a Bruce protocol without chest pain or ECG changes. Baseline 2D echocardiogram revealed normal left ventricular function with LVEF greater than 55% with moderate aortic insufficiency and tricuspid regurgitation. At peak exercise there was no echocardiographic evidence for ischemia.

## 2017-09-22 NOTE — Care Management (Signed)
SPOKE WITH DR ADAMS ABOUT CXR. INCREASED RISK OF ASPIRATION DUE TO HIATAL HERNIA. SENT TO SR SPARKS TO ADDRESS AS PART OF CLEARANCE

## 2017-09-22 NOTE — Pre-Procedure Instructions (Signed)
AS INSTRUCTED BY DR J ADAMS CRX TO FAXED TO DR SPARKS WHO IS SEEING PT 09/23/17 FOR CLEARANCE

## 2017-09-22 NOTE — Pre-Procedure Instructions (Signed)
CXR sent to Dr. Izora Ribas and Anesthesia for review.

## 2017-09-23 DIAGNOSIS — E119 Type 2 diabetes mellitus without complications: Secondary | ICD-10-CM | POA: Diagnosis not present

## 2017-09-23 DIAGNOSIS — I10 Essential (primary) hypertension: Secondary | ICD-10-CM | POA: Diagnosis not present

## 2017-09-23 DIAGNOSIS — K449 Diaphragmatic hernia without obstruction or gangrene: Secondary | ICD-10-CM | POA: Diagnosis not present

## 2017-09-23 DIAGNOSIS — K219 Gastro-esophageal reflux disease without esophagitis: Secondary | ICD-10-CM | POA: Diagnosis not present

## 2017-09-23 NOTE — Pre-Procedure Instructions (Signed)
Cleared by dr sparks 09/23/17

## 2017-09-27 ENCOUNTER — Emergency Department: Payer: PPO

## 2017-09-27 ENCOUNTER — Emergency Department
Admission: EM | Admit: 2017-09-27 | Discharge: 2017-09-27 | Disposition: A | Discharge status: Home / Self Care | Payer: PPO | Attending: Student in an Organized Health Care Education/Training Program | Admitting: Student in an Organized Health Care Education/Training Program

## 2017-09-27 DIAGNOSIS — S0562XA Penetrating wound without foreign body of left eyeball, initial encounter: Secondary | ICD-10-CM | POA: Diagnosis not present

## 2017-09-27 DIAGNOSIS — Z79899 Other long term (current) drug therapy: Secondary | ICD-10-CM | POA: Insufficient documentation

## 2017-09-27 DIAGNOSIS — W01198A Fall on same level from slipping, tripping and stumbling with subsequent striking against other object, initial encounter: Secondary | ICD-10-CM | POA: Diagnosis not present

## 2017-09-27 DIAGNOSIS — Y939 Activity, unspecified: Secondary | ICD-10-CM | POA: Insufficient documentation

## 2017-09-27 DIAGNOSIS — S99191A Other physeal fracture of right metatarsal, initial encounter for closed fracture: Secondary | ICD-10-CM | POA: Diagnosis not present

## 2017-09-27 DIAGNOSIS — Y929 Unspecified place or not applicable: Secondary | ICD-10-CM | POA: Diagnosis not present

## 2017-09-27 DIAGNOSIS — S0280XA Fracture of other specified skull and facial bones, unspecified side, initial encounter for closed fracture: Secondary | ICD-10-CM | POA: Diagnosis not present

## 2017-09-27 DIAGNOSIS — S9031XA Contusion of right foot, initial encounter: Secondary | ICD-10-CM | POA: Diagnosis not present

## 2017-09-27 DIAGNOSIS — Z7982 Long term (current) use of aspirin: Secondary | ICD-10-CM | POA: Insufficient documentation

## 2017-09-27 DIAGNOSIS — Y998 Other external cause status: Secondary | ICD-10-CM | POA: Diagnosis not present

## 2017-09-27 DIAGNOSIS — S0990XA Unspecified injury of head, initial encounter: Secondary | ICD-10-CM | POA: Diagnosis not present

## 2017-09-27 DIAGNOSIS — W19XXXA Unspecified fall, initial encounter: Secondary | ICD-10-CM

## 2017-09-27 DIAGNOSIS — S0181XA Laceration without foreign body of other part of head, initial encounter: Secondary | ICD-10-CM | POA: Diagnosis not present

## 2017-09-27 DIAGNOSIS — S0502XA Injury of conjunctiva and corneal abrasion without foreign body, left eye, initial encounter: Secondary | ICD-10-CM | POA: Insufficient documentation

## 2017-09-27 DIAGNOSIS — S99911A Unspecified injury of right ankle, initial encounter: Secondary | ICD-10-CM | POA: Diagnosis not present

## 2017-09-27 DIAGNOSIS — S0285XA Fracture of orbit, unspecified, initial encounter for closed fracture: Secondary | ICD-10-CM

## 2017-09-27 DIAGNOSIS — S92351A Displaced fracture of fifth metatarsal bone, right foot, initial encounter for closed fracture: Secondary | ICD-10-CM | POA: Diagnosis not present

## 2017-09-27 DIAGNOSIS — S0993XA Unspecified injury of face, initial encounter: Secondary | ICD-10-CM | POA: Diagnosis not present

## 2017-09-27 MED ORDER — TETRACAINE HCL 0.5 % OP SOLN
2.0000 [drp] | Freq: Once | OPHTHALMIC | Status: DC
  Filled 2017-09-27: qty 4

## 2017-09-27 MED ORDER — TRAMADOL HCL 50 MG PO TABS: 50.0000 mg | ORAL_TABLET | Freq: Four times a day (QID) | ORAL | 0 refills | Status: DC | PRN

## 2017-09-27 MED ORDER — FLUORESCEIN SODIUM 1 MG OP STRP
1.0000 | ORAL_STRIP | Freq: Once | OPHTHALMIC | Status: DC
  Filled 2017-09-27: qty 1

## 2017-09-27 MED ORDER — OFLOXACIN 0.3 % OP SOLN: 1.0000 [drp] | Freq: Four times a day (QID) | OPHTHALMIC | 0 refills | Status: DC

## 2017-09-27 MED ORDER — FLUORESCEIN-BENOXINATE 0.25-0.4 % OP SOLN
1.0000 [drp] | Freq: Once | OPHTHALMIC | Status: DC
  Filled 2017-09-27: qty 5

## 2017-09-27 MED ORDER — AMOXICILLIN-POT CLAVULANATE 875-125 MG PO TABS: 1.0000 | ORAL_TABLET | Freq: Two times a day (BID) | ORAL | 0 refills | Status: AC

## 2017-09-27 NOTE — ED Triage Notes (Addendum)
PT lost balance and fell, right leg usually gives patient "trouble", intermittent numbness over 1 year. No numbness at this time.Pt fell today in parking lot. No LOC. Left sided facial bruising and swelling around eye. Right ankle pain and swelling. Pt takes baby ASA daily but has not for last 5 day due to upcoming surgery. Vision normal out of eye Laceration above left eyebrow.

## 2017-09-27 NOTE — ED Notes (Signed)
Spoke with EDP regarding patient, orders received.

## 2017-09-27 NOTE — ED Notes (Signed)
Assisted patient to restroom.

## 2017-09-27 NOTE — Discharge Instructions (Signed)
Follow up with ENT and podiatry. Also call to have follow up with ophthalmology for repeat eye exam.  Return for worsening pain, or for any other concerns.

## 2017-09-27 NOTE — ED Provider Notes (Signed)
[facility] Emergency Department Provider Note    First MD Initiated Contact with Patient 09/27/17 1743     (approximate)  I have reviewed the triage vital signs and the nursing notes.   HISTORY  Chief Complaint Fall and Head Injury    HPI Teva Bronkema Cong is a 83 y.o. female who presents after mechanical fall tripping over a curb and landing on the left side her face. Denies any LOC. Takes baby aspirin but has not taken that for the last 5 days due to coming surgery. Patient states that she tripped because she has intermittent numbness in her right leg which caused her to fall today. This is why she is having her surgery. Denies any new numbness or tingling. No numbness or tingling right now. No back pain. Does have some right ankle pain as well as pain in the left eye.   Past Medical History:  Diagnosis Date  . Arthritis   . Dysrhythmia   . Environmental and seasonal allergies   . GERD (gastroesophageal reflux disease)   . Hernia, hiatal    Family History  Problem Relation Age of Onset  . Breast cancer Mother 72  . Breast cancer Maternal Aunt    Past Surgical History:  Procedure Laterality Date  . abd cyst removed    . CATARACT EXTRACTION W/ INTRAOCULAR LENS  IMPLANT, BILATERAL    . DUPUYTREN CONTRACTURE RELEASE Left   . KNEE SURGERY     right  . ROTATOR CUFF REPAIR Right   . WRIST SURGERY     right   Patient Active Problem List   Diagnosis Date Noted  . Low back pain 05/07/2017  . Carotid stenosis 05/07/2017      Prior to Admission medications   Medication Sig Start Date End Date Taking? Authorizing Provider  amoxicillin-clavulanate (AUGMENTIN) 875-125 MG tablet Take 1 tablet by mouth 2 (two) times daily. 09/27/17 10/04/17  Merlyn Lot, MD  aspirin EC 81 MG tablet Take 81 mg by mouth daily.     [provider]  atenolol (TENORMIN) 25 MG tablet Take 25 mg by mouth 2 (two) times daily.  02/15/17   [provider]    calcium carbonate (OSCAL) 1500 (600 Ca) MG TABS tablet Take 600 mg of elemental calcium by mouth 2 (two) times daily with a meal.     [provider]  cyclobenzaprine (FLEXERIL) 10 MG tablet Take 10 mg by mouth daily as needed for muscle spasms.  01/22/16   [provider]  fexofenadine (ALLEGRA) 60 MG tablet Take 60 mg by mouth daily as needed for allergies.     [provider]  guaiFENesin (MUCINEX) 600 MG 12 hr tablet Take 600 mg by mouth 2 (two) times daily as needed for cough.     [provider]  Multiple Vitamins-Minerals (CENTRUM SILVER 50+WOMEN PO) Take 1 tablet by mouth daily.     [provider]  ofloxacin (OCUFLOX) 0.3 % ophthalmic solution Place 1 drop into the left eye 4 (four) times daily. 09/27/17   Merlyn Lot, MD  Omega-3 Fatty Acids (FISH OIL PO) Take 1 tablet by mouth daily.     [provider]  omeprazole (PRILOSEC) 20 MG capsule Take 20 mg by mouth 2 (two) times daily before a meal.  03/31/17   [provider]  ondansetron (ZOFRAN ODT) 4 MG disintegrating tablet Take 1 tablet (4 mg total) by mouth every 8 (eight) hours as needed for nausea or vomiting. Patient  not taking: Reported on 06/28/2017 03/07/16   Harvest Dark, MD  traMADol (ULTRAM) 50 MG tablet Take 1 tablet (50 mg total) by mouth every 6 (six) hours as needed. 09/27/17 09/27/18  Merlyn Lot, MD    Allergies Morphine and related; Oxycontin [oxycodone]; and Percocet [oxycodone-acetaminophen]    Social History Social History  Substance Use Topics  . Smoking status: Never Smoker  . Smokeless tobacco: Never Used  . Alcohol use 2.4 oz/week    4 Glasses of wine per week    Review of Systems Patient denies headaches, rhinorrhea, blurry vision, numbness, shortness of breath, chest pain, edema, cough, abdominal pain, nausea, vomiting, diarrhea, dysuria, fevers, rashes or hallucinations unless otherwise stated above in  HPI. ____________________________________________   PHYSICAL EXAM:  VITAL SIGNS: Vitals:   09/27/17 1845 09/27/17 1915  BP: (!) 197/97 (!) 181/93  Pulse: 78 85  Resp:    Temp:    SpO2: 100% 98%    Constitutional: Alert and oriented. Well appearing and in no acute distress. Eyes: Conjunctivae are normal. slight for seen uptake just be on the multiple limbus of the left eye at roughly 8:00. There is no evidence of globe rupture. No proptosis. Does have some periorbital swelling and ecchymosis, no extraocular motion entrapment bilaterally Head: left periorbital ecchymosis and small subcentimeter superficial laceration to the the left lateral eyebrow. Nose: No congestion/rhinnorhea. Mouth/Throat: Mucous membranes are moist.   Neck: No stridor. Painless ROM.  Cardiovascular: Normal rate, regular rhythm. Grossly normal heart sounds.  Good peripheral circulation. Respiratory: Normal respiratory effort.  No retractions. Lungs CTAB. Gastrointestinal: Soft and nontender. No distention. No abdominal bruits. No CVA tenderness. Genitourinary:  Musculoskeletal: tenderness and swelling to the base of the fifth metatarsal of the right foot. No joint effusions. Neurologic:  Normal speech and language. No gross focal neurologic deficits are appreciated. No facial droop Skin:  Skin is warm, dry and intact. No rash noted. Psychiatric: Mood and affect are normal. Speech and behavior are normal.  ____________________________________________   LABS (all labs ordered are listed, but only abnormal results are displayed)  No results found for this or any previous visit (from the past 24 hour(s)). ____________________________________________ ____________________________________________  [id number]  I personally reviewed all radiographic images ordered to evaluate for the above acute complaints and reviewed radiology reports and findings.  These findings were personally discussed with the patient.   Please see medical record for radiology report.  ____________________________________________   PROCEDURES  Procedure(s) performed:  Marland KitchenMarland KitchenLaceration Repair Date/Time: 09/27/2017 7:18 PM Performed by: Merlyn Lot Authorized by: Merlyn Lot   Consent:    Consent obtained:  Verbal   Consent given by:  Patient Anesthesia (see MAR for exact dosages):    Anesthesia method:  None Laceration details:    Length (cm):  1   Depth (mm):  1 Repair type:    Repair type:  Simple Exploration:    Wound exploration: wound explored through full range of motion   Treatment:    Area cleansed with:  Hibiclens   Amount of cleaning:  Standard Skin repair:    Repair method:  Tissue adhesive Approximation:    Approximation:  Close   Vermilion border: well-aligned   Post-procedure details:    Patient tolerance of procedure:  Tolerated well, no immediate complications      Critical Care performed: no ____________________________________________   INITIAL IMPRESSION / ASSESSMENT AND PLAN / ED COURSE  Pertinent labs & imaging results that were available during my care of the patient were reviewed by  me and considered in my medical decision making (see chart for details).  DDX: sah, sdh, edh, fracture, contusion, soft tissue injury, viscous injury, concussion, hemorrhage   Heather Miranda is a 83 y.o. who presents to the ED with injuries as described above. Laceration repaired. No evidence of globe rupture. Patient does have evidence of orbital wall fractures with herniation but no evidence of extraocular motion or muscle entrapment. I spoke with ENT, Dr. Kathyrn Sheriff, feels comfortable following patient in clinic. We'll start patient on prophylactic antibiotics. We'll give optic antibiotic drops for small corneal abrasion. Patient placed in fracture Shuford dancers fracture of the right foot. No other evidence of traumatic injury.       ____________________________________________   FINAL CLINICAL IMPRESSION(S) / ED DIAGNOSES  Final diagnoses:  Closed fracture of orbital wall, initial encounter ([place])  Fall, initial encounter  Closed fracture of base of fifth metatarsal bone of right foot at metaphyseal-diaphyseal junction, initial encounter  Laceration of forehead, initial encounter  Abrasion of left cornea, initial encounter      NEW MEDICATIONS STARTED DURING THIS VISIT:  New Prescriptions   AMOXICILLIN-CLAVULANATE (AUGMENTIN) 875-125 MG TABLET    Take 1 tablet by mouth 2 (two) times daily.   OFLOXACIN (OCUFLOX) 0.3 % OPHTHALMIC SOLUTION    Place 1 drop into the left eye 4 (four) times daily.   TRAMADOL (ULTRAM) 50 MG TABLET    Take 1 tablet (50 mg total) by mouth every 6 (six) hours as needed.     Note:  This document was prepared using Dragon voice recognition software and may include unintentional dictation errors.    Merlyn Lot, MD 09/27/17 [phone]

## 2017-09-29 ENCOUNTER — Observation Stay
Admission: RE | Admit: 2017-09-29 | Discharge: 2017-09-30 | Disposition: A | Discharge status: Home / Self Care | Payer: PPO | Source: Ambulatory Visit | Attending: Neurosurgery | Admitting: Neurosurgery

## 2017-09-29 ENCOUNTER — Ambulatory Visit: Payer: PPO | Admitting: Anesthesiology

## 2017-09-29 ENCOUNTER — Encounter
Admission: RE | Admit: 2017-09-29 | Discharge: 2017-09-29 | Disposition: A | Discharge status: Home / Self Care | Payer: PPO | Source: Ambulatory Visit | Attending: Internal Medicine | Admitting: Internal Medicine

## 2017-09-29 ENCOUNTER — Encounter
Admission: RE | Disposition: A | Discharge status: Home / Self Care | Payer: Self-pay | Source: Ambulatory Visit | Attending: Neurosurgery

## 2017-09-29 ENCOUNTER — Encounter: Payer: Self-pay | Admitting: *Deleted

## 2017-09-29 ENCOUNTER — Ambulatory Visit: Payer: PPO

## 2017-09-29 DIAGNOSIS — K219 Gastro-esophageal reflux disease without esophagitis: Secondary | ICD-10-CM | POA: Diagnosis not present

## 2017-09-29 DIAGNOSIS — Z7982 Long term (current) use of aspirin: Secondary | ICD-10-CM | POA: Diagnosis not present

## 2017-09-29 DIAGNOSIS — M48062 Spinal stenosis, lumbar region with neurogenic claudication: Principal | ICD-10-CM | POA: Insufficient documentation

## 2017-09-29 DIAGNOSIS — Z419 Encounter for procedure for purposes other than remedying health state, unspecified: Secondary | ICD-10-CM

## 2017-09-29 DIAGNOSIS — Z885 Allergy status to narcotic agent status: Secondary | ICD-10-CM | POA: Diagnosis not present

## 2017-09-29 DIAGNOSIS — M5416 Radiculopathy, lumbar region: Secondary | ICD-10-CM | POA: Diagnosis present

## 2017-09-29 DIAGNOSIS — I739 Peripheral vascular disease, unspecified: Secondary | ICD-10-CM | POA: Insufficient documentation

## 2017-09-29 DIAGNOSIS — M1990 Unspecified osteoarthritis, unspecified site: Secondary | ICD-10-CM | POA: Diagnosis not present

## 2017-09-29 DIAGNOSIS — K449 Diaphragmatic hernia without obstruction or gangrene: Secondary | ICD-10-CM | POA: Insufficient documentation

## 2017-09-29 HISTORY — PX: LUMBAR LAMINECTOMY/DECOMPRESSION MICRODISCECTOMY: SHX5026

## 2017-09-29 HISTORY — PX: BACK SURGERY: SHX140

## 2017-09-29 LAB — ABO/RH: ABO/RH(D): O POS

## 2017-09-29 SURGERY — LUMBAR LAMINECTOMY/DECOMPRESSION MICRODISCECTOMY 1 LEVEL
Anesthesia: Choice

## 2017-09-29 MED ORDER — DEXAMETHASONE SODIUM PHOSPHATE 4 MG/ML IJ SOLN
INTRAMUSCULAR | Status: DC | PRN
  Administered 2017-09-29: 10 mg via INTRAVENOUS

## 2017-09-29 MED ORDER — THROMBIN 5000 UNITS EX SOLR
CUTANEOUS | Status: AC
  Filled 2017-09-29: qty 5000

## 2017-09-29 MED ORDER — FENTANYL CITRATE (PF) 100 MCG/2ML IJ SOLN: 25.0000 ug | INTRAMUSCULAR | Status: DC | PRN

## 2017-09-29 MED ORDER — METHOCARBAMOL 500 MG PO TABS: 500.0000 mg | ORAL_TABLET | Freq: Four times a day (QID) | ORAL | Status: DC | PRN

## 2017-09-29 MED ORDER — REMIFENTANIL HCL 1 MG IV SOLR
INTRAVENOUS | Status: DC | PRN
  Administered 2017-09-29: .1 ug/kg/min via INTRAVENOUS

## 2017-09-29 MED ORDER — PHENYLEPHRINE HCL 10 MG/ML IJ SOLN
INTRAMUSCULAR | Status: AC
  Filled 2017-09-29: qty 1

## 2017-09-29 MED ORDER — SODIUM CHLORIDE 0.9 % IJ SOLN
INTRAMUSCULAR | Status: AC
  Filled 2017-09-29: qty 50

## 2017-09-29 MED ORDER — SODIUM CHLORIDE 0.9 % IV SOLN
INTRAVENOUS | Status: AC | PRN
  Administered 2017-09-29: 20 mL via INTRAMUSCULAR

## 2017-09-29 MED ORDER — VANCOMYCIN HCL IN DEXTROSE 1-5 GM/200ML-% IV SOLN
1000.0000 mg | Freq: Once | INTRAVENOUS | Status: AC
  Administered 2017-09-29: 1000 mg via INTRAVENOUS

## 2017-09-29 MED ORDER — ROCURONIUM BROMIDE 50 MG/5ML IV SOLN
INTRAVENOUS | Status: AC
  Filled 2017-09-29: qty 1

## 2017-09-29 MED ORDER — METHYLPREDNISOLONE ACETATE 40 MG/ML IJ SUSP
INTRAMUSCULAR | Status: DC | PRN
  Administered 2017-09-29: 40 mg

## 2017-09-29 MED ORDER — EVICEL 2 ML EX KIT
PACK | CUTANEOUS | Status: DC | PRN
  Administered 2017-09-29: 2 mL

## 2017-09-29 MED ORDER — TRAMADOL HCL 50 MG PO TABS: 50.0000 mg | ORAL_TABLET | Freq: Four times a day (QID) | ORAL | Status: DC | PRN

## 2017-09-29 MED ORDER — FENTANYL CITRATE (PF) 100 MCG/2ML IJ SOLN
INTRAMUSCULAR | Status: AC
  Filled 2017-09-29: qty 2

## 2017-09-29 MED ORDER — ONDANSETRON HCL 4 MG/2ML IJ SOLN
INTRAMUSCULAR | Status: DC | PRN
  Administered 2017-09-29: 4 mg via INTRAVENOUS

## 2017-09-29 MED ORDER — BACITRACIN 50000 UNITS IM SOLR
INTRAMUSCULAR | Status: AC
  Filled 2017-09-29: qty 1

## 2017-09-29 MED ORDER — PHENYLEPHRINE HCL 10 MG/ML IJ SOLN
INTRAVENOUS | Status: DC | PRN
  Administered 2017-09-29: 50 ug/min via INTRAVENOUS
  Administered 2017-09-29: 30 ug/min via INTRAVENOUS

## 2017-09-29 MED ORDER — DEXAMETHASONE SODIUM PHOSPHATE 10 MG/ML IJ SOLN
INTRAMUSCULAR | Status: AC
  Filled 2017-09-29: qty 1

## 2017-09-29 MED ORDER — METHYLPREDNISOLONE ACETATE 40 MG/ML IJ SUSP
INTRAMUSCULAR | Status: AC
  Filled 2017-09-29: qty 1

## 2017-09-29 MED ORDER — SODIUM CHLORIDE 0.9% FLUSH
3.0000 mL | Freq: Two times a day (BID) | INTRAVENOUS | Status: DC
  Administered 2017-09-29: 3 mL via INTRAVENOUS

## 2017-09-29 MED ORDER — GUAIFENESIN ER 600 MG PO TB12: 600.0000 mg | ORAL_TABLET | Freq: Two times a day (BID) | ORAL | Status: DC | PRN

## 2017-09-29 MED ORDER — GELATIN ABSORBABLE 12-7 MM EX MISC
CUTANEOUS | Status: AC
  Filled 2017-09-29: qty 1

## 2017-09-29 MED ORDER — CEFAZOLIN SODIUM-DEXTROSE 2-4 GM/100ML-% IV SOLN: 2.0000 g | Freq: Once | INTRAVENOUS | Status: DC

## 2017-09-29 MED ORDER — ACETAMINOPHEN 650 MG RE SUPP: 650.0000 mg | RECTAL | Status: DC | PRN

## 2017-09-29 MED ORDER — BUPIVACAINE LIPOSOME 1.3 % IJ SUSP
INTRAMUSCULAR | Status: AC
  Filled 2017-09-29: qty 20

## 2017-09-29 MED ORDER — LIDOCAINE HCL (PF) 2 % IJ SOLN
INTRAMUSCULAR | Status: AC
  Filled 2017-09-29: qty 4

## 2017-09-29 MED ORDER — PROPOFOL 10 MG/ML IV BOLUS
INTRAVENOUS | Status: DC | PRN
  Administered 2017-09-29: 100 mg via INTRAVENOUS
  Administered 2017-09-29: 80 mg via INTRAVENOUS

## 2017-09-29 MED ORDER — SODIUM CHLORIDE FLUSH 0.9 % IV SOLN
INTRAVENOUS | Status: AC
  Filled 2017-09-29: qty 10

## 2017-09-29 MED ORDER — BUPIVACAINE HCL (PF) 0.5 % IJ SOLN
INTRAMUSCULAR | Status: AC
  Filled 2017-09-29: qty 30

## 2017-09-29 MED ORDER — PANTOPRAZOLE SODIUM 40 MG PO TBEC
40.0000 mg | DELAYED_RELEASE_TABLET | Freq: Every day | ORAL | Status: DC
  Administered 2017-09-30: 40 mg via ORAL
  Filled 2017-09-29: qty 1

## 2017-09-29 MED ORDER — SUCCINYLCHOLINE CHLORIDE 20 MG/ML IJ SOLN
INTRAMUSCULAR | Status: DC | PRN
  Administered 2017-09-29: 70 mg via INTRAVENOUS

## 2017-09-29 MED ORDER — BACITRACIN 50000 UNITS IM SOLR
INTRAMUSCULAR | Status: DC | PRN
  Administered 2017-09-29: 50000 [IU]

## 2017-09-29 MED ORDER — ATENOLOL 25 MG PO TABS
25.0000 mg | ORAL_TABLET | Freq: Two times a day (BID) | ORAL | Status: DC
  Administered 2017-09-30: 25 mg via ORAL
  Filled 2017-09-29 (×2): qty 1

## 2017-09-29 MED ORDER — HYDROMORPHONE HCL 1 MG/ML IJ SOLN: 0.2500 mg | INTRAMUSCULAR | Status: DC | PRN

## 2017-09-29 MED ORDER — SODIUM CHLORIDE 0.9% FLUSH: 3.0000 mL | INTRAVENOUS | Status: DC | PRN

## 2017-09-29 MED ORDER — THROMBIN 5000 UNITS EX SOLR
CUTANEOUS | Status: DC | PRN
  Administered 2017-09-29: 5000 [IU] via TOPICAL

## 2017-09-29 MED ORDER — BUPIVACAINE-EPINEPHRINE (PF) 0.5% -1:200000 IJ SOLN
INTRAMUSCULAR | Status: AC
  Filled 2017-09-29: qty 30

## 2017-09-29 MED ORDER — SODIUM CHLORIDE 0.9 % IV SOLN: 250.0000 mL | INTRAVENOUS | Status: DC

## 2017-09-29 MED ORDER — ROCURONIUM BROMIDE 100 MG/10ML IV SOLN
INTRAVENOUS | Status: DC | PRN
  Administered 2017-09-29: 5 mg via INTRAVENOUS

## 2017-09-29 MED ORDER — SENNOSIDES-DOCUSATE SODIUM 8.6-50 MG PO TABS: 1.0000 | ORAL_TABLET | Freq: Every evening | ORAL | Status: DC | PRN

## 2017-09-29 MED ORDER — ONDANSETRON HCL 4 MG/2ML IJ SOLN: 4.0000 mg | Freq: Four times a day (QID) | INTRAMUSCULAR | Status: DC | PRN

## 2017-09-29 MED ORDER — PROPOFOL 10 MG/ML IV BOLUS
INTRAVENOUS | Status: AC
  Filled 2017-09-29: qty 20

## 2017-09-29 MED ORDER — ACETAMINOPHEN 325 MG PO TABS
650.0000 mg | ORAL_TABLET | ORAL | Status: DC | PRN
  Administered 2017-09-30: 650 mg via ORAL
  Filled 2017-09-29: qty 2

## 2017-09-29 MED ORDER — GLYCOPYRROLATE 0.2 MG/ML IJ SOLN
INTRAMUSCULAR | Status: DC | PRN
  Administered 2017-09-29: 0.2 mg via INTRAVENOUS

## 2017-09-29 MED ORDER — GELATIN ABSORBABLE 12-7 MM EX MISC
CUTANEOUS | Status: DC | PRN
  Administered 2017-09-29: 1

## 2017-09-29 MED ORDER — BUPIVACAINE HCL 0.5 % IJ SOLN
INTRAMUSCULAR | Status: DC | PRN
  Administered 2017-09-29: 20 mL

## 2017-09-29 MED ORDER — METHOCARBAMOL 1000 MG/10ML IJ SOLN
500.0000 mg | Freq: Four times a day (QID) | INTRAMUSCULAR | Status: DC | PRN
  Filled 2017-09-29: qty 5

## 2017-09-29 MED ORDER — REMIFENTANIL HCL 1 MG IV SOLR
INTRAVENOUS | Status: AC
  Filled 2017-09-29: qty 1000

## 2017-09-29 MED ORDER — LACTATED RINGERS IV SOLN
INTRAVENOUS | Status: DC
  Administered 2017-09-29 (×2): via INTRAVENOUS

## 2017-09-29 MED ORDER — ONDANSETRON HCL 4 MG PO TABS: 4.0000 mg | ORAL_TABLET | Freq: Four times a day (QID) | ORAL | Status: DC | PRN

## 2017-09-29 MED ORDER — LIDOCAINE HCL (CARDIAC) 20 MG/ML IV SOLN
INTRAVENOUS | Status: DC | PRN
  Administered 2017-09-29: 60 mg via INTRAVENOUS

## 2017-09-29 MED ORDER — SUCCINYLCHOLINE CHLORIDE 20 MG/ML IJ SOLN
INTRAMUSCULAR | Status: AC
Start: 2017-09-29 — End: 2017-09-29
  Filled 2017-09-29: qty 1

## 2017-09-29 MED ORDER — ONDANSETRON HCL 4 MG/2ML IJ SOLN
INTRAMUSCULAR | Status: AC
  Filled 2017-09-29: qty 2

## 2017-09-29 MED ORDER — OFLOXACIN 0.3 % OP SOLN
1.0000 [drp] | Freq: Four times a day (QID) | OPHTHALMIC | Status: DC
  Administered 2017-09-29 – 2017-09-30 (×4): 1 [drp] via OPHTHALMIC
  Filled 2017-09-29: qty 5

## 2017-09-29 MED ORDER — ONDANSETRON HCL 4 MG/2ML IJ SOLN: 4.0000 mg | Freq: Once | INTRAMUSCULAR | Status: DC | PRN

## 2017-09-29 MED ORDER — LORATADINE 10 MG PO TABS
10.0000 mg | ORAL_TABLET | Freq: Every day | ORAL | Status: DC
  Administered 2017-09-30: 10 mg via ORAL
  Filled 2017-09-29 (×2): qty 1

## 2017-09-29 MED ORDER — BUPIVACAINE-EPINEPHRINE (PF) 0.5% -1:200000 IJ SOLN
INTRAMUSCULAR | Status: DC | PRN
  Administered 2017-09-29: 20 mL

## 2017-09-29 MED ORDER — PHENYLEPHRINE HCL 10 MG/ML IJ SOLN
INTRAMUSCULAR | Status: DC | PRN
  Administered 2017-09-29: 50 ug via INTRAVENOUS
  Administered 2017-09-29: 200 ug via INTRAVENOUS
  Administered 2017-09-29 (×2): 50 ug via INTRAVENOUS

## 2017-09-29 MED ORDER — LIDOCAINE HCL (PF) 4 % IJ SOLN
INTRAMUSCULAR | Status: DC | PRN
  Administered 2017-09-29: 4 mL via RESPIRATORY_TRACT

## 2017-09-29 MED ORDER — ACETAMINOPHEN 500 MG PO TABS
1000.0000 mg | ORAL_TABLET | Freq: Four times a day (QID) | ORAL | Status: AC
  Administered 2017-09-29 – 2017-09-30 (×4): 1000 mg via ORAL
  Filled 2017-09-29 (×4): qty 2

## 2017-09-29 MED ORDER — SODIUM CHLORIDE 0.9 % IV SOLN
INTRAVENOUS | Status: DC
  Administered 2017-09-29: 12:00:00 via INTRAVENOUS

## 2017-09-29 MED ORDER — BUPIVACAINE LIPOSOME 1.3 % IJ SUSP
INTRAMUSCULAR | Status: DC | PRN
  Administered 2017-09-29: 20 mL

## 2017-09-29 SURGICAL SUPPLY — 66 items
BLADE BOVIE TIP EXT 4 (BLADE) ×3 IMPLANT
BUR NEURO DRILL SOFT 3.0X3.8M (BURR) ×3 IMPLANT
CANISTER SUCT 1200ML W/VALVE (MISCELLANEOUS) ×6 IMPLANT
CHLORAPREP W/TINT 26ML (MISCELLANEOUS) ×6 IMPLANT
CNTNR SPEC 2.5X3XGRAD LEK (MISCELLANEOUS) ×1
CONT SPEC 4OZ STER OR WHT (MISCELLANEOUS) ×2
CONTAINER SPEC 2.5X3XGRAD LEK (MISCELLANEOUS) ×1 IMPLANT
COUNTER NEEDLE 20/40 LG (NEEDLE) ×3 IMPLANT
COVER LIGHT HANDLE STERIS (MISCELLANEOUS) ×6 IMPLANT
CUP MEDICINE 2OZ PLAST GRAD ST (MISCELLANEOUS) ×6 IMPLANT
DERMABOND ADVANCED (GAUZE/BANDAGES/DRESSINGS) ×2
DERMABOND ADVANCED .7 DNX12 (GAUZE/BANDAGES/DRESSINGS) ×1 IMPLANT
DRAPE C-ARM 42X72 X-RAY (DRAPES) ×6 IMPLANT
DRAPE LAPAROTOMY 100X77 ABD (DRAPES) ×3 IMPLANT
DRAPE MICROSCOPE SPINE 48X150 (DRAPES) ×3 IMPLANT
DRAPE POUCH INSTRU U-SHP 10X18 (DRAPES) ×3 IMPLANT
DRAPE SURG 17X11 SM STRL (DRAPES) ×12 IMPLANT
DRSG TEGADERM 4X4.75 (GAUZE/BANDAGES/DRESSINGS) IMPLANT
DRSG TELFA 4X3 1S NADH ST (GAUZE/BANDAGES/DRESSINGS) IMPLANT
DURASEAL APPLICATOR TIP (TIP) IMPLANT
DURASEAL SPINE SEALANT 3ML (MISCELLANEOUS) IMPLANT
ELECT CAUTERY BLADE TIP 2.5 (TIP) ×3
ELECT EZSTD 165MM 6.5IN (MISCELLANEOUS)
ELECT REM PT RETURN 9FT ADLT (ELECTROSURGICAL) ×3
ELECTRODE CAUTERY BLDE TIP 2.5 (TIP) ×1 IMPLANT
ELECTRODE EZSTD 165MM 6.5IN (MISCELLANEOUS) IMPLANT
ELECTRODE REM PT RTRN 9FT ADLT (ELECTROSURGICAL) ×1 IMPLANT
FRAME EYE SHIELD (PROTECTIVE WEAR) ×3 IMPLANT
GLOVE BIO SURGEON STRL SZ 6.5 (GLOVE) ×4 IMPLANT
GLOVE BIO SURGEONS STRL SZ 6.5 (GLOVE) ×2
GLOVE BIOGEL PI IND STRL 7.0 (GLOVE) ×2 IMPLANT
GLOVE BIOGEL PI INDICATOR 7.0 (GLOVE) ×4
GLOVE SURG SYN 8.5  E (GLOVE) ×6
GLOVE SURG SYN 8.5 E (GLOVE) ×3 IMPLANT
GOWN SRG XL LVL 3 NONREINFORCE (GOWNS) ×1 IMPLANT
GOWN STRL NON-REIN TWL XL LVL3 (GOWNS) ×2
GOWN STRL REUS W/ TWL LRG LVL3 (GOWN DISPOSABLE) ×1 IMPLANT
GOWN STRL REUS W/TWL LRG LVL3 (GOWN DISPOSABLE) ×2
GRADUATE 1200CC STRL 31836 (MISCELLANEOUS) ×3 IMPLANT
GRAFT DURAGEN MATRIX 1WX1L (Tissue) ×3 IMPLANT
KIT SPINAL PRONEVIEW (KITS) ×3 IMPLANT
KNIFE BAYONET SHORT DISCETOMY (MISCELLANEOUS) IMPLANT
MARKER SKIN DUAL TIP RULER LAB (MISCELLANEOUS) ×9 IMPLANT
NDL SAFETY ECLIPSE 18X1.5 (NEEDLE) ×1 IMPLANT
NEEDLE HYPO 18GX1.5 SHARP (NEEDLE) ×2
NEEDLE HYPO 22GX1.5 SAFETY (NEEDLE) ×3 IMPLANT
NS IRRIG 1000ML POUR BTL (IV SOLUTION) ×3 IMPLANT
PACK LAMINECTOMY NEURO (CUSTOM PROCEDURE TRAY) ×3 IMPLANT
PAD ARMBOARD 7.5X6 YLW CONV (MISCELLANEOUS) ×3 IMPLANT
PATTIES SURGICAL .5X1.5 (GAUZE/BANDAGES/DRESSINGS) IMPLANT
SPOGE SURGIFLO 8M (HEMOSTASIS) ×2
SPONGE SURGIFLO 8M (HEMOSTASIS) ×1 IMPLANT
STAPLER SKIN PROX 35W (STAPLE) IMPLANT
SUT DVC VLOC 3-0 CL 6 P-12 (SUTURE) ×3 IMPLANT
SUT NURALON 4 0 TR CR/8 (SUTURE) IMPLANT
SUT VIC AB 0 CT1 27 (SUTURE) ×2
SUT VIC AB 0 CT1 27XCR 8 STRN (SUTURE) ×1 IMPLANT
SUT VIC AB 2-0 CT1 18 (SUTURE) ×3 IMPLANT
SYR 20CC LL (SYRINGE) ×3 IMPLANT
SYR 30ML LL (SYRINGE) ×6 IMPLANT
SYR 3ML LL SCALE MARK (SYRINGE) ×3 IMPLANT
SYRINGE 10CC LL (SYRINGE) ×3 IMPLANT
TOWEL OR 17X26 4PK STRL BLUE (TOWEL DISPOSABLE) ×12 IMPLANT
TUBE METRX 18MMX5CM (INSTRUMENTS) ×3 IMPLANT
TUBING CONNECTING 10 (TUBING) ×2 IMPLANT
TUBING CONNECTING 10' (TUBING) ×1

## 2017-09-29 NOTE — H&P (Signed)
I have reviewed and confirmed my history and physical from 09/09/17 with no additions or changes. Plan for L4-5 minimally invasive decompression.  Risks and benefits reviewed.  Heart sounds normal no MRG. Chest Clear to Auscultation Bilaterally.

## 2017-09-29 NOTE — Progress Notes (Signed)
Patient on Monday fracturing right ankle and orbital wall left eye.  Right ortho shoe removed for OR, to be sent with patient to floor postop-instructed from ED not to place any weight on the right ankle.  Dr. Izora Ribas and Dr. Kayleen Memos aware of injuries.

## 2017-09-29 NOTE — Anesthesia Preprocedure Evaluation (Signed)
Anesthesia Evaluation  Patient identified by MRN, date of birth, ID band Patient awake    Reviewed: Allergy & Precautions, NPO status , Patient's Chart, lab work & pertinent test results, reviewed documented beta blocker date and time   Airway Mallampati: II  TM Distance: >3 FB     Dental   Pulmonary neg pulmonary ROS,    Pulmonary exam normal        Cardiovascular + Peripheral Vascular Disease  Normal cardiovascular exam+ dysrhythmias      Neuro/Psych  Neuromuscular disease negative psych ROS   GI/Hepatic Neg liver ROS, hiatal hernia, GERD  ,  Endo/Other  negative endocrine ROS  Renal/GU negative Renal ROS  negative genitourinary   Musculoskeletal  (+) Arthritis , Osteoarthritis,    Abdominal Normal abdominal exam  (+)   Peds negative pediatric ROS (+)  Hematology negative hematology ROS (+)   Anesthesia Other Findings   Reproductive/Obstetrics                             Anesthesia Physical Anesthesia Plan  ASA: III  Anesthesia Plan:    Post-op Pain Management:    Induction: Intravenous  PONV Risk Score and Plan:   Airway Management Planned: Oral ETT  Additional Equipment:   Intra-op Plan:   Post-operative Plan: Extubation in OR  Informed Consent: I have reviewed the patients History and Physical, chart, labs and discussed the procedure including the risks, benefits and alternatives for the proposed anesthesia with the patient or authorized representative who has indicated his/her understanding and acceptance.   Dental advisory given  Plan Discussed with: CRNA and Surgeon  Anesthesia Plan Comments:         Anesthesia Quick Evaluation

## 2017-09-29 NOTE — Progress Notes (Signed)
Pharmacy note: Cefazolin 2 grams x1 and vancomycin 1 gram x1ordered for surgical prophylaxis per consult. Confirmed with surgical RN that vanc was for surgical prophylaxis only.  Sim Boast, PharmD, BCPS  09/29/17 6:16 AM

## 2017-09-29 NOTE — Op Note (Signed)
Indications: Ms. Moss is an 83 yo female who presented with neurogenic claudication secondary to lumbar spondylosis at L4-5. After failing conservative management, she asked that we proceed with surgery.  Findings: severe lumbar stenosis, scarring of the epidural space  Preoperative Diagnosis: Lumbar Stenosis with neurogenic claudication Postoperative Diagnosis: same   EBL: 25 ml IVF: 1100 ml Drains: none Disposition: Extubated and Stable to PACU Complications: none  No foley catheter was placed.   Preoperative Note:   Risks of surgery discussed include: infection, bleeding, stroke, coma, death, paralysis, CSF leak, nerve/spinal cord injury, numbness, tingling, weakness, complex regional pain syndrome, recurrent stenosis and/or disc herniation, vascular injury, development of instability, neck/back pain, need for further surgery, persistent symptoms, development of deformity, and the risks of anesthesia. They understood these risks and have agreed to proceed.  Operative Note:   1. L4-5 lumbar decompression including central laminectomy and bilateral medial facetectomies including foraminotomies  The patient was then brought from the preoperative center with intravenous access established.  The patient underwent general anesthesia and endotracheal tube intubation, and was then rotated on the [place] rail top where all pressure points were appropriately padded.  The skin was then thoroughly cleansed.  Perioperative antibiotic prophylaxis was administered.  Sterile prep and drapes were then applied and a timeout was then observed.  C-arm was brought into the field under sterile conditions and under lateral visualization the L4-5 interspace was identified and marked.  The incision was marked on the right and injected with local anesthetic. Once this was complete a 2 cm incision was opened with the use of a #10 blade knife.  The metrx tubes were sequentially advanced and confirmed in position.  An 23mm by 59mm tube was locked in place to the bed side attachment.  Fluoroscopy was then removed from the field.  The microscope was then sterilely brought into the field and muscle creep was hemostased with a bipolar and resected with a pituitary rongeur.  A Bovie extender was then used to expose the spinous process and lamina.  Careful attention was placed to not violate the facet capsule. A 3 mm matchstick drill bit was then used to make a hemi-laminotomy trough until the ligamentum flavum was exposed.  This was extended to the base of the spinous process and to the contralateral side to remove all the central bone from each side.  Once this was complete and the underlying ligamentum flavum was visualized, it was dissected with a curette and resected with Kerrison rongeurs.  Extensive ligamentum hypertrophy was noted, requiring a substantial amount of time and care for removal.  The dura was identified and palpated. The kerrison rongeur was then used to remove the medial facet bilaterally until no compression was noted.  A balltip probe was used to confirm decompression of the right L5 nerve root.  Additional attention was paid to completion of the contralateral L4-5 foraminotomy until the left L5 nerve root was completely free.  Once this was complete, L4-5 central decompression including medial facetectomy and foraminotomy was confirmed and decompression on both sides was confirmed. During removal of the ligamentum flavum superiorly, an area of adhesion was noted.  The dura was thinned and a small rent noted during removal of ligamentum causing a small CSF leak.  To augment closure of the dura, Duragen and Tisseel were placed onto the dura.    The wound was copiously irrigated. The tube system was then removed under microscopic visualization and hemostasis was obtained with a bipolar.  The fascial layer  was reapproximated with the use of a 0 Vicryl suture.  Subcutaneous tissue layer was reapproximated  using 2-0 Vicryl suture.  3-0 monocryl was placed in subcuticular fashion. The skin was then cleansed and Dermabond was used to close the skin opening.  Patient was then rotated back to the preoperative bed awakened from anesthesia and taken to recovery all counts are correct in this case.  I performed the entire procedure with the assistance of [name] PA as an Pensions consultant.  Kaeo Jacome K. Izora Ribas MD

## 2017-09-29 NOTE — Anesthesia Procedure Notes (Signed)
Procedure Name: Intubation Date/Time: 09/29/2017 7:30 AM Performed by: Rosaria Ferries, [place] Maiolo Pre-anesthesia Checklist: Patient identified, Emergency Drugs available, Suction available and Patient being monitored Patient Re-evaluated:Patient Re-evaluated prior to induction Oxygen Delivery Method: Circle system utilized Preoxygenation: Pre-oxygenation with 100% oxygen Induction Type: IV induction Laryngoscope Size: Mac and 3 Grade View: Grade I Tube size: 7.0 mm Number of attempts: 1 Airway Equipment and Method: Bougie stylet and LTA kit utilized Placement Confirmation: positive ETCO2 and breath sounds checked- equal and bilateral Secured at: 21 cm Tube secured with: Tape Dental Injury: Teeth and Oropharynx as per pre-operative assessment  Difficulty Due To: Difficult Airway- due to anterior larynx Future Recommendations: Recommend- induction with short-acting agent, and alternative techniques readily available

## 2017-09-29 NOTE — NC FL2 (Signed)
[place] LEVEL OF CARE SCREENING TOOL     IDENTIFICATION  Patient Name: Heather Miranda Birthdate: 05/06/1929 Sex: female Admission Date (Current Location): 09/29/2017  [place] and [place] Number:  Engineering geologist and Address:  [facility], [address] [place], [place] [address]      Provider Number: [id number]  Attending Physician Name and Address:  Meade Maw, MD  Relative Name and Phone Number:       Current Level of Care: Hospital Recommended Level of Care: [place] Prior Approval Number:    Date Approved/Denied:   PASRR Number:  [id number] )  Discharge Plan: SNF    Current Diagnoses: Patient Active Problem List   Diagnosis Date Noted  . Lumbar radiculopathy 09/29/2017  . Low back pain 05/07/2017  . Carotid stenosis 05/07/2017    Orientation RESPIRATION BLADDER Height & Weight     Self, Time, Situation, Place  Normal Continent Weight: 129 lb 15.7 oz (59 kg) Height:  5' (152.4 cm)  BEHAVIORAL SYMPTOMS/MOOD NEUROLOGICAL BOWEL NUTRITION STATUS      Continent Diet (Diet: Regular )  AMBULATORY STATUS COMMUNICATION OF NEEDS Skin   Extensive Assist Verbally Surgical wounds (Back Incision. )                       Personal Care Assistance Level of Assistance  Bathing, Feeding, Dressing Bathing Assistance: Limited assistance Feeding assistance: Independent Dressing Assistance: Limited assistance     Functional Limitations Info  Sight, Hearing, Speech Sight Info: Adequate Hearing Info: Adequate Speech Info: Adequate    SPECIAL CARE FACTORS FREQUENCY  PT (By licensed PT), OT (By licensed OT)     PT Frequency:  (5) OT Frequency:  (5)            Contractures      Additional Factors Info  Code Status, Allergies Code Status Info:  (Full Code. ) Allergies Info:  (Morphine And Related, Oxycontin Oxycodone, Percocet Oxycodone-acetaminophen)           Current Medications  (09/29/2017):  This is the current hospital active medication list Current Facility-Administered Medications  Medication Dose Route Frequency Provider Last Rate Last Dose  . 0.9 %  sodium chloride infusion   Intravenous Continuous [name], PA-C   Stopped at 09/29/17 1454  . 0.9 %  sodium chloride infusion  250 mL Intravenous Continuous [name], PA-C      . acetaminophen (TYLENOL) tablet 650 mg  650 mg Oral Q4H PRN [name], PA-C       Or  . acetaminophen (TYLENOL) suppository 650 mg  650 mg Rectal Q4H PRN [name], PA-C      . acetaminophen (TYLENOL) tablet 1,000 mg  1,000 mg Oral Q6H [name], PA-C   1,000 mg at 09/29/17 1253  . atenolol (TENORMIN) tablet 25 mg  25 mg Oral BID [name], PA-C      . guaiFENesin [facility]) 12 hr tablet 600 mg  600 mg Oral BID PRN [name], PA-C      . HYDROmorphone (DILAUDID) injection 0.25 mg  0.25 mg Intravenous Q2H PRN [name], PA-C      . loratadine (CLARITIN) tablet 10 mg  10 mg Oral Daily [name], PA-C      . methocarbamol (ROBAXIN) tablet 500 mg  500 mg Oral Q6H PRN [name], PA-C       Or  . methocarbamol (ROBAXIN) 500 mg in dextrose 5 % 50 mL IVPB  500 mg Intravenous Q6H PRN [name], PA-C      . ofloxacin (OCUFLOX) 0.3 % ophthalmic solution 1 drop  1 drop Left Eye QID [name], PA-C   1 drop at 09/29/17 1219  . ondansetron (ZOFRAN) tablet 4 mg  4 mg Oral Q6H PRN [name], PA-C       Or  . ondansetron [facility]) injection 4 mg  4 mg Intravenous Q6H PRN [name], PA-C      . [START ON 09/30/2017] pantoprazole (PROTONIX) EC tablet 40 mg  40 mg Oral Daily Ferri, Amanda, PA-C      . senna-docusate (Senokot-S) tablet 1 tablet  1 tablet Oral QHS PRN [name], PA-C      . sodium chloride flush (NS) 0.9 % injection 3 mL  3 mL Intravenous Q12H [name], PA-C      . sodium chloride flush (NS) 0.9 % injection 3 mL  3 mL Intravenous PRN [name], PA-C      . traMADol Veatrice Bourbon)  tablet 50 mg  50 mg Oral Q6H PRN [name], PA-C         Discharge Medications: Please see discharge summary for a list of discharge medications.  Relevant Imaging Results:  Relevant Lab Results:   Additional Information  (SSN: [id number])  Zackory Pudlo, Veronia Beets, LCSW

## 2017-09-29 NOTE — Progress Notes (Signed)
  History: Heather Miranda is POD0 L4-5 lumbar decompression including central laminectomy and bilateral medial facetectomies including foraminotomies for lumbar stenosis with neurogenic claudication. Patient tolerated procedure well. Minor CSF leak noted during procedure. Patient is doing very well postoperatively. Back pain is 2/10, controlled with Tylenol. Patient has ambulated to restroom and voided without issue. Symptoms prior to surgery have not presented yet and seem to have resolved  Physical Exam: Vitals:   09/29/17 1040 09/29/17 1102  BP: (!) 142/62 135/60  Pulse: (!) 58 (!) 58  Resp: 13 16  Temp:  97.8 F (36.6 C)  SpO2: 97% 98%    AA Ox3 CNI Strength:5/5 throughout, sensation intact and symmetric upper and lower extremities.   Data:  No results for input(s): NA, K, CL, CO2, BUN, CREATININE, LABGLOM, GLUCOSE, CALCIUM in the last 168 hours. No results for input(s): AST, ALT, ALKPHOS in the last 168 hours.  Invalid input(s): TBILI   No results for input(s): WBC, HGB, HCT, PLT in the last 168 hours. No results for input(s): APTT, INR in the last 168 hours.      Assessment/Plan:  Heather Miranda is POD0 and doing very well. Post op ambulation is complicated by recent fall that resulted in broken foot. Patient will stay at health facility provided by her current residence for temporary care during her recovery from surgery and injuries sustained from fall. Pain is adequately controlled with Tylenol at this time. Will reevaluate status in the morning.   [name] PA-C Department of Neurosurgery

## 2017-09-29 NOTE — Transfer of Care (Signed)
Immediate Anesthesia Transfer of Care Note  Patient: Heather Miranda  Procedure(s) Performed: LUMBAR LAMINECTOMY/DECOMPRESSION MICRODISCECTOMY 1 LEVEL L4-5 (N/A )  Patient Location: PACU  Anesthesia Type:General  Level of Consciousness: awake, alert , oriented and patient cooperative  Airway & Oxygen Therapy: Patient Spontanous Breathing and Patient connected to nasal cannula oxygen  Post-op Assessment: Report given to RN and Post -op Vital signs reviewed and stable  Post vital signs: Reviewed and stable  Last Vitals:  Vitals:   09/29/17 0618  BP: (!) 156/67  Pulse: 72  Resp: 14  Temp: 36.5 C  SpO2: 98%    Last Pain:  Vitals:   09/29/17 0618  TempSrc: Tympanic  PainSc: 0-No pain         Complications: No apparent anesthesia complications

## 2017-09-29 NOTE — Anesthesia Post-op Follow-up Note (Signed)
Anesthesia QCDR form completed.        

## 2017-09-30 ENCOUNTER — Encounter: Payer: Self-pay | Admitting: Neurosurgery

## 2017-09-30 DIAGNOSIS — M48061 Spinal stenosis, lumbar region without neurogenic claudication: Secondary | ICD-10-CM | POA: Diagnosis not present

## 2017-09-30 DIAGNOSIS — K5903 Drug induced constipation: Secondary | ICD-10-CM | POA: Insufficient documentation

## 2017-09-30 DIAGNOSIS — E119 Type 2 diabetes mellitus without complications: Secondary | ICD-10-CM | POA: Diagnosis not present

## 2017-09-30 DIAGNOSIS — I10 Essential (primary) hypertension: Secondary | ICD-10-CM | POA: Diagnosis not present

## 2017-09-30 DIAGNOSIS — M48062 Spinal stenosis, lumbar region with neurogenic claudication: Secondary | ICD-10-CM | POA: Diagnosis not present

## 2017-09-30 MED ORDER — METHOCARBAMOL 500 MG PO TABS: 500.0000 mg | ORAL_TABLET | Freq: Four times a day (QID) | ORAL | 0 refills | Status: DC | PRN

## 2017-09-30 MED ORDER — TRAMADOL HCL 50 MG PO TABS: 50.0000 mg | ORAL_TABLET | Freq: Four times a day (QID) | ORAL | 0 refills | Status: DC | PRN

## 2017-09-30 NOTE — Progress Notes (Signed)
Pt continues to sleep in chair. resp 14pm. Continue to monitor.

## 2017-09-30 NOTE — Progress Notes (Signed)
Disregard last entry. Wrong patient.

## 2017-09-30 NOTE — Clinical Social Work Placement (Signed)
   CLINICAL SOCIAL WORK PLACEMENT  NOTE  Date:  09/30/2017  Patient Details  Name: Heather Miranda MRN: [id number] Date of Birth: [birth year]  Clinical Social Work is seeking post-discharge placement for this patient at the [place] level of care (*CSW will initial, date and re-position this form in  chart as items are completed):  Yes   Patient/family provided with [facility] Work Department's list of facilities offering this level of care within the geographic area requested by the patient (or if unable, by the patient's family).  Yes   Patient/family informed of their freedom to choose among providers that offer the needed level of care, that participate in Medicare, Medicaid or managed care program needed by the patient, have an available bed and are willing to accept the patient.  Yes   Patient/family informed of [facility]'s ownership interest in [facility] and [facility], as well as of the fact that they are under no obligation to receive care at these facilities.  PASRR submitted to EDS on       PASRR number received on       Existing PASRR number confirmed on 09/29/17     FL2 transmitted to all facilities in geographic area requested by pt/family on 09/29/17     FL2 transmitted to all facilities within larger geographic area on       Patient informed that his/her managed care company has contracts with or will negotiate with certain facilities, including the following:        Yes   Patient/family informed of bed offers received.  Patient chooses bed at  [facility] )     Physician recommends and patient chooses bed at      Patient to be transferred to  [facility] ) on 09/30/17.  Patient to be transferred to facility by  (Patient's son Jonni Sanger will provide transport per patient. )     Patient family notified on 09/30/17 of transfer.  Name of family member notified:   ([place] left patient's son Jonni Sanger a Advertising account executive. )      PHYSICIAN       Additional Comment:    _______________________________________________ Lynsay Fesperman, Veronia Beets, LCSW 09/30/2017, 11:18 AM

## 2017-09-30 NOTE — Clinical Social Work Note (Addendum)
Clinical Social Work Assessment  Patient Details  Name: Heather Miranda MRN: [id number] Date of Birth: 1929/12/18  Date of referral:  09/30/17               Reason for consult:  Facility Placement                Permission sought to share information with:  Family Supports Permission granted to share information::  Yes, Verbal Permission Granted  Name::    [place]:: [place]    Relationship::     Contact Information:     Housing/Transportation Living arrangements for the past 2 months:  [place] of Information:  Patient Patient Interpreter Needed:  None Criminal Activity/Legal Involvement Pertinent to Current Situation/Hospitalization:  No - Comment as needed Significant Relationships:  Adult Children Lives with:  Self Do you feel safe going back to the place where you live?  Yes Need for family participation in patient care:  Yes (Comment)  Care giving concerns: Patient lives alone at [facility] independent living.    Social Worker assessment / plan: Holiday representative (CSW) and social work Theatre manager received verbal consult from Chief Strategy Officer that patient wants to go to [employer]. Social work Theatre manager met with patient alone at bedside. Patient was alert and oriented x4. Social work Theatre manager introduced herself and explained the role of [place]. Social work Theatre manager explained that [employer] would not cover the skilled nursing facility. Patient explained that she will private pay for 2 nights and then have someone come stay with her at night time at home. Patient is aware she is stable for discharge today and will be picked up by her son. RN will call report at [phone]. Per [facility] admissions coordinator at [facility] patient can come today to room 355. CSW sent D/C orders to [facility] via [place]. Please reconsult if future social work needs arise. CSW signing off.     Employment status:   Retired Nurse, adult PT Recommendations:  [place] / Referral to community resources:  [place]  Patient/Family's Response to care: Patient will be discharged to [employer]   Patient/Family's Understanding of and Emotional Response to Diagnosis, Current Treatment, and Prognosis: Patient was pleasant and thanked social work Theatre manager for her assistance.  Emotional Assessment Appearance:  Appears stated age Attitude/Demeanor/Rapport:    Affect (typically observed):  Accepting, Pleasant, Happy Orientation:  Oriented to Self, Oriented to Place, Oriented to  Time, Oriented to Situation Alcohol / Substance use:  Other Psych involvement (Current and /or in the community):  No (Comment)  Discharge Needs  Concerns to be addressed:  Financial / Insurance Concerns, Care Coordination Readmission within the last 30 days:  No Current discharge risk:  Dependent with Mobility Barriers to Discharge:  No Barriers Identified   [place], Student-Social Work 09/30/2017, 9:35 AM

## 2017-09-30 NOTE — Care Management Note (Signed)
Case Management Note  Patient Details  Name: Heather Miranda MRN: [id number] Date of Birth: 1929-04-25  Subjective/Objective:  Observation letter delivered. Patient lives in the independent living at [place].She will be going to the skilled unit for 2 days private pay per patient. CSW aware. No needs identified.                   Action/Plan:   Expected Discharge Date:  09/30/17               Expected Discharge Plan:  [place]  In-House Referral:     Discharge planning Services     Post Acute Care Choice:    Choice offered to:     DME Arranged:    DME Agency:     HH Arranged:    [place] Agency:     Status of Service:  Completed, signed off  If discussed at [employer], dates discussed:    Additional Comments:  Jolly Mango, RN 09/30/2017, 8:44 AM

## 2017-09-30 NOTE — Anesthesia Postprocedure Evaluation (Signed)
Anesthesia Post Note  Patient: Heather Miranda  Procedure(s) Performed: LUMBAR LAMINECTOMY/DECOMPRESSION MICRODISCECTOMY 1 LEVEL L4-5 (N/A )  Patient location during evaluation: PACU Anesthesia Type: General Level of consciousness: awake and alert and oriented Pain management: pain level controlled Vital Signs Assessment: post-procedure vital signs reviewed and stable Respiratory status: spontaneous breathing Cardiovascular status: blood pressure returned to baseline Anesthetic complications: no     Last Vitals:  Vitals:   09/29/17 2134 09/29/17 2347  BP: (!) 104/47 (!) 113/48  Pulse: (!) 59 (!) 55  Resp:  18  Temp:  37 C  SpO2:  95%    Last Pain:  Vitals:   09/30/17 0043  TempSrc:   PainSc: Asleep                 Chistian Kasler

## 2017-09-30 NOTE — Care Management Obs Status (Signed)
[place] NOTIFICATION   Patient Details  Name: JAYELLE PAGE MRN: [id number] Date of Birth: 03-Nov-1929   Medicare Observation Status Notification Given:  Yes    Jolly Mango, RN 09/30/2017, 8:43 AM

## 2017-09-30 NOTE — Discharge Summary (Signed)
  History: Heather Miranda is POD1 from L4/5 lumbar decompression. She has done very well post operatively. Able to ambulate, void, and eat without issue. Pain is currently 1/10. Symptoms prior to surgery have resolved at this time.   Physical Exam: Vitals:   09/29/17 2134 09/29/17 2347  BP: (!) 104/47 (!) 113/48  Pulse: (!) 59 (!) 55  Resp:  18  Temp:  98.6 F (37 C)  SpO2:  95%   Allergies as of 09/30/2017      Reactions   Morphine And Related Nausea Only   Oxycontin [oxycodone] Nausea Only   Percocet [oxycodone-acetaminophen] Nausea Only      Medication List    STOP taking these medications   aspirin EC 81 MG tablet   cyclobenzaprine 10 MG tablet Commonly known as:  FLEXERIL   ondansetron 4 MG disintegrating tablet Commonly known as:  ZOFRAN ODT     TAKE these medications   amoxicillin-clavulanate 875-125 MG tablet Commonly known as:  AUGMENTIN Take 1 tablet by mouth 2 (two) times daily.   atenolol 25 MG tablet Commonly known as:  TENORMIN Take 25 mg by mouth 2 (two) times daily.   calcium carbonate 1500 (600 Ca) MG Tabs tablet Commonly known as:  OSCAL Take 600 mg of elemental calcium by mouth 2 (two) times daily with a meal.   CENTRUM SILVER 50+WOMEN PO Take 1 tablet by mouth daily.   fexofenadine 60 MG tablet Commonly known as:  ALLEGRA Take 60 mg by mouth daily as needed for allergies.   FISH OIL PO Take 1 tablet by mouth daily.   guaiFENesin 600 MG 12 hr tablet Commonly known as:  MUCINEX Take 600 mg by mouth 2 (two) times daily as needed for cough.   methocarbamol 500 MG tablet Commonly known as:  ROBAXIN Take 1 tablet (500 mg total) by mouth every 6 (six) hours as needed for muscle spasms.   ofloxacin 0.3 % ophthalmic solution Commonly known as:  OCUFLOX Place 1 drop into the left eye 4 (four) times daily.   omeprazole 20 MG capsule Commonly known as:  PRILOSEC Take 20 mg by mouth 2 (two) times daily before a meal.   traMADol 50 MG  tablet Commonly known as:  ULTRAM Take 1 tablet (50 mg total) by mouth every 6 (six) hours as needed. What changed:  Another medication with the same name was added. Make sure you understand how and when to take each.   traMADol 50 MG tablet Commonly known as:  ULTRAM Take 1 tablet (50 mg total) by mouth every 6 (six) hours as needed for severe pain (Breakthrough pain). What changed:  You were already taking a medication with the same name, and this prescription was added. Make sure you understand how and when to take each.      AA Ox3 CNI Skin: Incision site clean, dry, intact. No erythema, swelling, drainage.  Strength:5/5 throughout, sensation intact and symmetric upper and lower extremities.   No post op imaging ordered.   Assessment/Plan:  Heather Miranda has done very well postoperatively. Symptoms present prior to surgery have resolved. Pain is minimal and adequately controlled. Patient is expected to have temporary care at facility she resides for injuries sustained during fall and post procedure care. Pain will continued to be controlled with tramadol and robaxin as needed.  Patient scheduled for follow up at clinic in 2 weeks to monitor progress.  [name] PA-C Department of Neurosurgery

## 2017-09-30 NOTE — Progress Notes (Signed)
Pt ready for discharge back to [place]. Son at bedside and will transport patient. Discharge orders reviewed. Pt pain free and understands f/u appts.

## 2017-09-30 NOTE — Discharge Instructions (Signed)

## 2017-10-01 ENCOUNTER — Other Ambulatory Visit: Payer: Self-pay

## 2017-10-01 MED ORDER — TRAMADOL HCL 50 MG PO TABS: 50.0000 mg | ORAL_TABLET | Freq: Four times a day (QID) | ORAL | 0 refills | Status: AC | PRN

## 2017-10-01 NOTE — Telephone Encounter (Signed)
Rx sent to [facility] phone : [phone] , fax : [phone]  

## 2017-10-05 ENCOUNTER — Encounter: Payer: Self-pay | Admitting: Neurosurgery

## 2017-10-06 DIAGNOSIS — M79671 Pain in right foot: Secondary | ICD-10-CM | POA: Diagnosis not present

## 2017-10-06 DIAGNOSIS — S92354A Nondisplaced fracture of fifth metatarsal bone, right foot, initial encounter for closed fracture: Secondary | ICD-10-CM | POA: Diagnosis not present

## 2017-10-27 DIAGNOSIS — S92354D Nondisplaced fracture of fifth metatarsal bone, right foot, subsequent encounter for fracture with routine healing: Secondary | ICD-10-CM | POA: Diagnosis not present

## 2017-10-27 DIAGNOSIS — M79671 Pain in right foot: Secondary | ICD-10-CM | POA: Diagnosis not present

## 2017-11-17 DIAGNOSIS — S92354D Nondisplaced fracture of fifth metatarsal bone, right foot, subsequent encounter for fracture with routine healing: Secondary | ICD-10-CM | POA: Diagnosis not present

## 2017-12-01 DIAGNOSIS — E119 Type 2 diabetes mellitus without complications: Secondary | ICD-10-CM | POA: Diagnosis not present

## 2017-12-01 DIAGNOSIS — E7800 Pure hypercholesterolemia, unspecified: Secondary | ICD-10-CM | POA: Diagnosis not present

## 2017-12-01 DIAGNOSIS — Z79899 Other long term (current) drug therapy: Secondary | ICD-10-CM | POA: Diagnosis not present

## 2017-12-08 DIAGNOSIS — E7800 Pure hypercholesterolemia, unspecified: Secondary | ICD-10-CM | POA: Diagnosis not present

## 2017-12-08 DIAGNOSIS — Z1231 Encounter for screening mammogram for malignant neoplasm of breast: Secondary | ICD-10-CM | POA: Diagnosis not present

## 2017-12-08 DIAGNOSIS — D649 Anemia, unspecified: Secondary | ICD-10-CM | POA: Diagnosis not present

## 2017-12-08 DIAGNOSIS — Z79899 Other long term (current) drug therapy: Secondary | ICD-10-CM | POA: Diagnosis not present

## 2017-12-08 DIAGNOSIS — Z0000 Encounter for general adult medical examination without abnormal findings: Secondary | ICD-10-CM | POA: Diagnosis not present

## 2017-12-08 DIAGNOSIS — I10 Essential (primary) hypertension: Secondary | ICD-10-CM | POA: Diagnosis not present

## 2017-12-08 DIAGNOSIS — E119 Type 2 diabetes mellitus without complications: Secondary | ICD-10-CM | POA: Diagnosis not present

## 2017-12-08 DIAGNOSIS — K219 Gastro-esophageal reflux disease without esophagitis: Secondary | ICD-10-CM | POA: Diagnosis not present

## 2017-12-10 ENCOUNTER — Other Ambulatory Visit: Payer: Self-pay | Admitting: Internal Medicine

## 2017-12-10 DIAGNOSIS — Z1231 Encounter for screening mammogram for malignant neoplasm of breast: Secondary | ICD-10-CM

## 2017-12-15 DIAGNOSIS — S92354D Nondisplaced fracture of fifth metatarsal bone, right foot, subsequent encounter for fracture with routine healing: Secondary | ICD-10-CM | POA: Diagnosis not present

## 2017-12-29 DIAGNOSIS — Z961 Presence of intraocular lens: Secondary | ICD-10-CM | POA: Diagnosis not present

## 2018-01-06 ENCOUNTER — Ambulatory Visit
Admission: RE | Admit: 2018-01-06 | Discharge: 2018-01-06 | Disposition: A | Discharge status: Home / Self Care | Payer: PPO | Source: Ambulatory Visit | Attending: Internal Medicine | Admitting: Internal Medicine

## 2018-01-06 DIAGNOSIS — Z1231 Encounter for screening mammogram for malignant neoplasm of breast: Secondary | ICD-10-CM | POA: Diagnosis not present

## 2018-01-13 DIAGNOSIS — Z96651 Presence of right artificial knee joint: Secondary | ICD-10-CM | POA: Diagnosis not present

## 2018-01-17 DIAGNOSIS — S92354D Nondisplaced fracture of fifth metatarsal bone, right foot, subsequent encounter for fracture with routine healing: Secondary | ICD-10-CM | POA: Diagnosis not present

## 2018-04-18 DIAGNOSIS — M25512 Pain in left shoulder: Secondary | ICD-10-CM | POA: Diagnosis not present

## 2018-04-18 DIAGNOSIS — S29012A Strain of muscle and tendon of back wall of thorax, initial encounter: Secondary | ICD-10-CM | POA: Diagnosis not present

## 2018-04-18 DIAGNOSIS — M9902 Segmental and somatic dysfunction of thoracic region: Secondary | ICD-10-CM | POA: Diagnosis not present

## 2018-04-22 DIAGNOSIS — M25512 Pain in left shoulder: Secondary | ICD-10-CM | POA: Diagnosis not present

## 2018-04-22 DIAGNOSIS — S29012A Strain of muscle and tendon of back wall of thorax, initial encounter: Secondary | ICD-10-CM | POA: Diagnosis not present

## 2018-04-22 DIAGNOSIS — M9902 Segmental and somatic dysfunction of thoracic region: Secondary | ICD-10-CM | POA: Diagnosis not present

## 2018-05-03 DIAGNOSIS — M25512 Pain in left shoulder: Secondary | ICD-10-CM | POA: Diagnosis not present

## 2018-05-03 DIAGNOSIS — S29012A Strain of muscle and tendon of back wall of thorax, initial encounter: Secondary | ICD-10-CM | POA: Diagnosis not present

## 2018-05-03 DIAGNOSIS — M9902 Segmental and somatic dysfunction of thoracic region: Secondary | ICD-10-CM | POA: Diagnosis not present

## 2018-05-05 DIAGNOSIS — Z872 Personal history of diseases of the skin and subcutaneous tissue: Secondary | ICD-10-CM | POA: Diagnosis not present

## 2018-05-05 DIAGNOSIS — L2089 Other atopic dermatitis: Secondary | ICD-10-CM | POA: Diagnosis not present

## 2018-05-10 ENCOUNTER — Ambulatory Visit (INDEPENDENT_AMBULATORY_CARE_PROVIDER_SITE_OTHER): Payer: PPO | Admitting: Vascular Surgery

## 2018-05-10 ENCOUNTER — Encounter (INDEPENDENT_AMBULATORY_CARE_PROVIDER_SITE_OTHER): Payer: Self-pay | Admitting: Vascular Surgery

## 2018-05-10 ENCOUNTER — Ambulatory Visit (INDEPENDENT_AMBULATORY_CARE_PROVIDER_SITE_OTHER): Payer: PPO

## 2018-05-10 VITALS — BP 140/71 | HR 60 | Resp 13 | Ht 60.5 in | Wt 133.0 lb

## 2018-05-10 DIAGNOSIS — I6523 Occlusion and stenosis of bilateral carotid arteries: Secondary | ICD-10-CM

## 2018-05-10 DIAGNOSIS — K219 Gastro-esophageal reflux disease without esophagitis: Secondary | ICD-10-CM | POA: Diagnosis not present

## 2018-05-10 DIAGNOSIS — M545 Low back pain: Secondary | ICD-10-CM | POA: Diagnosis not present

## 2018-05-10 NOTE — Assessment & Plan Note (Signed)
Continue antihypertensive medications as already ordered, these medications have been reviewed and there are no changes at this time.  Avoidence of caffeine and alcohol  Moderate elevation of the head of the bed  

## 2018-05-10 NOTE — Progress Notes (Signed)
MRN : [id number]  Heather Miranda is a 84 y.o. (02/18/1929) female who presents with chief complaint of  Chief Complaint  Patient presents with  . Follow-up    Carotid u/s follow up  .  History of Present Illness: Patient returns in follow-up of her carotid disease.  She has had back surgery since her last visit which has improved her back pain.  She denies any focal neurologic symptoms. Specifically, the patient denies amaurosis fugax, speech or swallowing difficulties, or arm or leg weakness or numbness.  Her carotid duplex today does show some progression of her right carotid stenosis now into the 60 to 79% on the right.  The left carotid stenosis remains in the 1 to 39% range.  Current Outpatient Medications  Medication Sig Dispense Refill  . atenolol (TENORMIN) 25 MG tablet Take 25 mg by mouth 2 (two) times daily.     . calcium carbonate (OSCAL) 1500 (600 Ca) MG TABS tablet Take 600 mg of elemental calcium by mouth 2 (two) times daily with a meal.     . fexofenadine (ALLEGRA) 60 MG tablet Take 60 mg by mouth daily as needed for allergies.     [place] Kitchen guaiFENesin (MUCINEX) 600 MG 12 hr tablet Take 600 mg by mouth 2 (two) times daily as needed for cough.     . Multiple Vitamins-Minerals (CENTRUM SILVER 50+WOMEN PO) Take 1 tablet by mouth daily.     Marland Kitchen ofloxacin (OCUFLOX) 0.3 % ophthalmic solution Place 1 drop into the left eye 4 (four) times daily. 5 mL 0  . Omega-3 Fatty Acids (FISH OIL PO) Take 1 tablet by mouth daily.     Marland Kitchen omeprazole (PRILOSEC) 20 MG capsule Take 20 mg by mouth 2 (two) times daily before a meal.     . triamcinolone cream (KENALOG) 0.1 %     . methocarbamol (ROBAXIN) 500 MG tablet Take 1 tablet (500 mg total) by mouth every 6 (six) hours as needed for muscle spasms. (Patient not taking: Reported on 05/10/2018) 120 tablet 0  . traMADol (ULTRAM) 50 MG tablet Take 1 tablet (50 mg total) by mouth every 6 (six) hours as needed for moderate pain or severe pain. (Patient not taking:  Reported on 05/10/2018) 120 tablet 0   No current facility-administered medications for this visit.     Past Medical History:  Diagnosis Date  . Arthritis   . Dysrhythmia   . Environmental and seasonal allergies   . GERD (gastroesophageal reflux disease)   . Hernia, hiatal     Past Surgical History:  Procedure Laterality Date  . abd cyst removed    . BACK SURGERY  09/29/2017  . CATARACT EXTRACTION W/ INTRAOCULAR LENS  IMPLANT, BILATERAL    . DUPUYTREN CONTRACTURE RELEASE Left   . KNEE SURGERY     right  . LUMBAR LAMINECTOMY/DECOMPRESSION MICRODISCECTOMY N/A 09/29/2017   Procedure: LUMBAR LAMINECTOMY/DECOMPRESSION MICRODISCECTOMY 1 LEVEL L4-5;  Surgeon: Meade Maw, MD;  Location: [facility] ORS;  Service: Neurosurgery;  Laterality: N/A;  . ROTATOR CUFF REPAIR Right   . WRIST SURGERY     right   Social History     Social History  Substance Use Topics  . Smoking status: Never Smoker  . Smokeless tobacco: Never Used  . Alcohol use No    Family History      Family History  Problem Relation Age of Onset  . Breast cancer Mother 74  . Breast cancer Maternal Aunt  Allergies  Allergen Reactions  . Morphine And Related Nausea Only  . Percocet [Oxycodone-Acetaminophen] Nausea Only     REVIEW OF SYSTEMS (Negative unless checked)  Constitutional: [] Weight loss  [] Fever  [] Chills Cardiac: [] Chest pain   [] Chest pressure   [] Palpitations   [] Shortness of breath when laying flat   [] Shortness of breath at rest   [] Shortness of breath with exertion. Vascular:  [] Pain in legs with walking   [] Pain in legs at rest   [] Pain in legs when laying flat   [] Claudication   [] Pain in feet when walking  [] Pain in feet at rest  [] Pain in feet when laying flat   [] History of DVT   [] Phlebitis   [] Swelling in legs   [] Varicose veins   [] Non-healing ulcers Pulmonary:   [] Uses home oxygen   [] Productive cough   [] Hemoptysis   [] Wheeze  [] COPD   [] Asthma Neurologic:   [] Dizziness  [] Blackouts   [] Seizures   [] History of stroke   [] History of TIA  [] Aphasia   [] Temporary blindness   [] Dysphagia   [] Weakness or numbness in arms   [] Weakness or numbness in legs Musculoskeletal:  [x] Arthritis   [] Joint swelling   [] Joint pain   [x] Low back pain Hematologic:  [] Easy bruising  [] Easy bleeding   [] Hypercoagulable state   [] Anemic  [] Hepatitis Gastrointestinal:  [] Blood in stool   [] Vomiting blood  [x] Gastroesophageal reflux/heartburn   [] Difficulty swallowing. Genitourinary:  [] Chronic kidney disease   [] Difficult urination  [] Frequent urination  [] Burning with urination   [] Blood in urine Skin:  [] Rashes   [] Ulcers   [] Wounds Psychological:  [] History of anxiety   []  History of major depression.    Physical Examination  Vitals:   05/10/18 1350 05/10/18 1351  BP: 131/71 140/71  Pulse: 60 60  Resp: 13   Weight: 133 lb (60.3 kg)   Height: 5' 0.5" (1.537 m)    Body mass index is 25.55 kg/m. Gen:  WD/WN, NAD.  Appears younger than stated age Head: [place]/AT, No temporalis wasting. Ear/Nose/Throat: Hearing grossly intact, nares w/o erythema or drainage, trachea midline Eyes: Conjunctiva clear. Sclera non-icteric Neck: Supple.  Right carotid bruit  Pulmonary:  Good air movement, equal and clear to auscultation bilaterally.  Cardiac: RRR, No JVD Vascular:  Vessel Right Left  Radial Palpable Palpable                                   Musculoskeletal: M/S 5/5 throughout.  No deformity or atrophy.  Walks with a cane.  No lower extremity edema. Neurologic: CN 2-12 intact. Sensation grossly intact in extremities.  Symmetrical.  Speech is fluent. Motor exam as listed above. Psychiatric: Judgment intact, Mood & affect appropriate for pt's clinical situation. Dermatologic: No rashes or ulcers noted.  No cellulitis or open wounds.      CBC Lab Results  Component Value Date   WBC 5.5 09/21/2017   HGB 13.3 09/21/2017   HCT 39.2 09/21/2017   MCV 91.8  09/21/2017   PLT 238 09/21/2017    BMET    Component Value Date/Time   NA 130 (L) 09/21/2017 0847   K 4.2 09/21/2017 0847   CL 97 (L) 09/21/2017 0847   CO2 25 09/21/2017 0847   GLUCOSE 78 09/21/2017 0847   BUN 16 09/21/2017 0847   CREATININE 0.68 09/21/2017 0847   CALCIUM 9.2 09/21/2017 0847   GFRNONAA >60 09/21/2017 0847   GFRAA >60  09/21/2017 0847   CrCl cannot be calculated (Patient's most recent lab result is older than the maximum 21 days allowed.).  COAG Lab Results  Component Value Date   INR 1.05 09/21/2017    Radiology No results found.   Assessment/Plan Low back pain Improved after back surgery  GERD (gastroesophageal reflux disease) Continue antihypertensive medications as already ordered, these medications have been reviewed and there are no changes at this time.  Avoidence of caffeine and alcohol  Moderate elevation of the head of the bed   Carotid stenosis Her carotid duplex today does show some progression of her right carotid stenosis now into the 60 to 79% on the right.  The left carotid stenosis remains in the 1 to 39% range.  With this, I believe we should shorten her follow-up interval and I will plan to see her back in 6 months.  We will continue the current medical regimen.  No role for intervention below 80% in a octogenarian without symptoms.    Leotis Pain, MD  05/10/2018 2:25 PM    This note was created with Dragon medical transcription system.  Any errors from dictation are purely unintentional

## 2018-05-10 NOTE — Assessment & Plan Note (Signed)
Her carotid duplex today does show some progression of her right carotid stenosis now into the 60 to 79% on the right.  The left carotid stenosis remains in the 1 to 39% range.  With this, I believe we should shorten her follow-up interval and I will plan to see her back in 6 months.  We will continue the current medical regimen.  No role for intervention below 80% in a octogenarian without symptoms.

## 2018-05-10 NOTE — Assessment & Plan Note (Signed)
Improved after back surgery

## 2018-06-02 DIAGNOSIS — I10 Essential (primary) hypertension: Secondary | ICD-10-CM | POA: Diagnosis not present

## 2018-06-02 DIAGNOSIS — Z79899 Other long term (current) drug therapy: Secondary | ICD-10-CM | POA: Diagnosis not present

## 2018-06-02 DIAGNOSIS — E119 Type 2 diabetes mellitus without complications: Secondary | ICD-10-CM | POA: Diagnosis not present

## 2018-06-16 DIAGNOSIS — E119 Type 2 diabetes mellitus without complications: Secondary | ICD-10-CM | POA: Diagnosis not present

## 2018-06-16 DIAGNOSIS — I10 Essential (primary) hypertension: Secondary | ICD-10-CM | POA: Diagnosis not present

## 2018-06-16 DIAGNOSIS — Z79899 Other long term (current) drug therapy: Secondary | ICD-10-CM | POA: Diagnosis not present

## 2018-06-16 DIAGNOSIS — K219 Gastro-esophageal reflux disease without esophagitis: Secondary | ICD-10-CM | POA: Diagnosis not present

## 2018-06-16 DIAGNOSIS — E7800 Pure hypercholesterolemia, unspecified: Secondary | ICD-10-CM | POA: Diagnosis not present

## 2018-09-08 DIAGNOSIS — L821 Other seborrheic keratosis: Secondary | ICD-10-CM | POA: Diagnosis not present

## 2018-09-08 DIAGNOSIS — L578 Other skin changes due to chronic exposure to nonionizing radiation: Secondary | ICD-10-CM | POA: Diagnosis not present

## 2018-09-08 DIAGNOSIS — L820 Inflamed seborrheic keratosis: Secondary | ICD-10-CM | POA: Diagnosis not present

## 2018-09-08 DIAGNOSIS — L570 Actinic keratosis: Secondary | ICD-10-CM | POA: Diagnosis not present

## 2018-09-08 DIAGNOSIS — Z1283 Encounter for screening for malignant neoplasm of skin: Secondary | ICD-10-CM | POA: Diagnosis not present

## 2018-10-27 DIAGNOSIS — Z96651 Presence of right artificial knee joint: Secondary | ICD-10-CM | POA: Diagnosis not present

## 2018-11-11 ENCOUNTER — Ambulatory Visit (INDEPENDENT_AMBULATORY_CARE_PROVIDER_SITE_OTHER): Payer: PPO

## 2018-11-11 ENCOUNTER — Ambulatory Visit (INDEPENDENT_AMBULATORY_CARE_PROVIDER_SITE_OTHER): Payer: PPO | Admitting: Vascular Surgery

## 2018-11-11 ENCOUNTER — Encounter (INDEPENDENT_AMBULATORY_CARE_PROVIDER_SITE_OTHER): Payer: Self-pay | Admitting: Vascular Surgery

## 2018-11-11 VITALS — BP 137/78 | HR 63 | Resp 16 | Ht 62.0 in | Wt 137.0 lb

## 2018-11-11 DIAGNOSIS — M545 Low back pain, unspecified: Secondary | ICD-10-CM

## 2018-11-11 DIAGNOSIS — I6523 Occlusion and stenosis of bilateral carotid arteries: Secondary | ICD-10-CM | POA: Diagnosis not present

## 2018-11-11 DIAGNOSIS — K219 Gastro-esophageal reflux disease without esophagitis: Secondary | ICD-10-CM | POA: Diagnosis not present

## 2018-11-11 NOTE — Progress Notes (Signed)
MRN : [id number]  Heather Miranda is a 84 y.o. (Feb 26, 1929) female who presents with chief complaint of  Chief Complaint  Patient presents with  . Follow-up    6 month Carotid f/u  .  History of Present Illness: Patient returns in follow-up of her carotid disease.  No complaints today.  She denies any focal neurologic symptoms. Specifically, the patient denies amaurosis fugax, speech or swallowing difficulties, or arm or leg weakness or numbness Her carotid duplex today shows velocities in the upper end of the 40 to 59% range on the right and in the 1 to 39% range on the left.  These are slightly better than the study 6 months ago.  Current Outpatient Medications  Medication Sig Dispense Refill  . aspirin 81 MG chewable tablet Chew by mouth daily.    Marland Kitchen atenolol (TENORMIN) 25 MG tablet Take 25 mg by mouth 2 (two) times daily.     . calcium carbonate (OSCAL) 1500 (600 Ca) MG TABS tablet Take 600 mg of elemental calcium by mouth 2 (two) times daily with a meal.     . Multiple Vitamins-Minerals (CENTRUM SILVER PO) Take by mouth.    Marland Kitchen omeprazole (PRILOSEC) 20 MG capsule Take 20 mg by mouth 2 (two) times daily before a meal.     . triamcinolone cream (KENALOG) 0.1 %     . fexofenadine (ALLEGRA) 60 MG tablet Take 60 mg by mouth daily as needed for allergies.     Marland Kitchen guaiFENesin (MUCINEX) 600 MG 12 hr tablet Take 600 mg by mouth 2 (two) times daily as needed for cough.      No current facility-administered medications for this visit.     Past Medical History:  Diagnosis Date  . Arthritis   . Dysrhythmia   . Environmental and seasonal allergies   . GERD (gastroesophageal reflux disease)   . Hernia, hiatal     Past Surgical History:  Procedure Laterality Date  . abd cyst removed    . BACK SURGERY  09/29/2017  . CATARACT EXTRACTION W/ INTRAOCULAR LENS  IMPLANT, BILATERAL    . DUPUYTREN CONTRACTURE RELEASE Left   . KNEE SURGERY     right  . LUMBAR LAMINECTOMY/DECOMPRESSION  MICRODISCECTOMY N/A 09/29/2017   Procedure: LUMBAR LAMINECTOMY/DECOMPRESSION MICRODISCECTOMY 1 LEVEL L4-5;  Surgeon: Meade Maw, MD;  Location: [facility] ORS;  Service: Neurosurgery;  Laterality: N/A;  . ROTATOR CUFF REPAIR Right   . WRIST SURGERY     right   Social History  Substance Use Topics  . Smoking status: Never Smoker  . Smokeless tobacco: Never Used  . Alcohol use No    Family History      Family History  Problem Relation Age of Onset  . Breast cancer Mother 67  . Breast cancer Maternal Aunt         Allergies  Allergen Reactions  . Morphine And Related Nausea Only  . Percocet [Oxycodone-Acetaminophen] Nausea Only     REVIEW OF SYSTEMS(Negative unless checked)  Constitutional: [] Weight loss[] Fever[] Chills Cardiac:[] Chest pain[] Chest pressure[x] Palpitations [] Shortness of breath when laying flat [] Shortness of breath at rest [] Shortness of breath with exertion. Vascular: [] Pain in legs with walking[] Pain in legsat rest[] Pain in legs when laying flat [] Claudication [] Pain in feet when walking [] Pain in feet at rest [] Pain in feet when laying flat [] History of DVT [] Phlebitis [] Swelling in legs [] Varicose veins [] Non-healing ulcers Pulmonary: [] Uses home oxygen [] Productive cough[] Hemoptysis [] Wheeze [] COPD [] Asthma Neurologic: [] Dizziness [] Blackouts [] Seizures [] History of stroke [] History of TIA[] Aphasia [] Temporary blindness[] Dysphagia []   Weaknessor numbness in arms [] Weakness or numbnessin legs Musculoskeletal: [x] Arthritis [] Joint swelling [] Joint pain [x] Low back pain Hematologic:[] Easy bruising[] Easy bleeding [] Hypercoagulable state [] Anemic [] Hepatitis Gastrointestinal:[] Blood in stool[] Vomiting blood[x] Gastroesophageal reflux/heartburn[] Difficulty swallowing. Genitourinary: [] Chronic kidney disease [] Difficulturination [] Frequenturination  [] Burning with urination[] Blood in urine Skin: [] Rashes [] Ulcers [] Wounds Psychological: [] History of anxiety[] History of major depression.    Physical Examination  Vitals:   11/11/18 1350 11/11/18 1351  BP: (!) 151/73 137/78  Pulse: (!) 59 63  Resp: 16   Weight: 137 lb (62.1 kg)   Height: 5\' 2"  (1.575 m)    Body mass index is 25.06 kg/m. Gen:  WD/WN, NAD Head: [place]/AT, No temporalis wasting. Ear/Nose/Throat: Hearing grossly intact, nares w/o erythema or drainage, trachea midline Eyes: Conjunctiva clear. Sclera non-icteric Neck: Supple.  Soft right carotid bruit  Pulmonary:  Good air movement, equal and clear to auscultation bilaterally.  Cardiac: RRR, No JVD Vascular:  Vessel Right Left  Radial Palpable Palpable                   Musculoskeletal: M/S 5/5 throughout.  No deformity or atrophy. Walks with a cane. Kyphosis present Neurologic: CN 2-12 intact. Sensation grossly intact in extremities.  Symmetrical.  Speech is fluent. Motor exam as listed above. Psychiatric: Judgment intact, Mood & affect appropriate for pt's clinical situation. Dermatologic: No rashes or ulcers noted.  No cellulitis or open wounds.      CBC Lab Results  Component Value Date   WBC 5.5 09/21/2017   HGB 13.3 09/21/2017   HCT 39.2 09/21/2017   MCV 91.8 09/21/2017   PLT 238 09/21/2017    BMET    Component Value Date/Time   NA 130 (L) 09/21/2017 0847   K 4.2 09/21/2017 0847   CL 97 (L) 09/21/2017 0847   CO2 25 09/21/2017 0847   GLUCOSE 78 09/21/2017 0847   BUN 16 09/21/2017 0847   CREATININE 0.68 09/21/2017 0847   CALCIUM 9.2 09/21/2017 0847   GFRNONAA >60 09/21/2017 0847   GFRAA >60 09/21/2017 0847   CrCl cannot be calculated (Patient's most recent lab result is older than the maximum 21 days allowed.).  COAG Lab Results  Component Value Date   INR 1.05 09/21/2017    Radiology No results found.   Assessment/Plan Low back pain Improved after back  surgery  GERD (gastroesophageal reflux disease) Continue antihypertensive medications as already ordered, these medications have been reviewed and there are no changes at this time.  Avoidence of caffeine and alcohol  Moderate elevation of the head of the bed   Carotid stenosis Her carotid duplex today shows velocities in the upper end of the 40 to 59% range on the right and in the 1 to 39% range on the left.  These are slightly better than the study 6 months ago.  Given her age and asymptomatic status, and annual surveillance at this point would be reasonable.  Continue aspirin therapy daily.    Leotis Pain, MD  11/11/2018 2:17 PM    This note was created with Dragon medical transcription system.  Any errors from dictation are purely unintentional

## 2018-11-11 NOTE — Assessment & Plan Note (Signed)
Her carotid duplex today shows velocities in the upper end of the 40 to 59% range on the right and in the 1 to 39% range on the left.  These are slightly better than the study 6 months ago.  Given her age and asymptomatic status, and annual surveillance at this point would be reasonable.  Continue aspirin therapy daily.

## 2018-12-05 ENCOUNTER — Other Ambulatory Visit: Payer: Self-pay | Admitting: Internal Medicine

## 2018-12-05 DIAGNOSIS — E119 Type 2 diabetes mellitus without complications: Secondary | ICD-10-CM | POA: Diagnosis not present

## 2018-12-05 DIAGNOSIS — E7800 Pure hypercholesterolemia, unspecified: Secondary | ICD-10-CM | POA: Diagnosis not present

## 2018-12-05 DIAGNOSIS — Z1231 Encounter for screening mammogram for malignant neoplasm of breast: Secondary | ICD-10-CM

## 2018-12-05 DIAGNOSIS — Z79899 Other long term (current) drug therapy: Secondary | ICD-10-CM | POA: Diagnosis not present

## 2018-12-05 DIAGNOSIS — I10 Essential (primary) hypertension: Secondary | ICD-10-CM | POA: Diagnosis not present

## 2018-12-12 DIAGNOSIS — E119 Type 2 diabetes mellitus without complications: Secondary | ICD-10-CM | POA: Diagnosis not present

## 2018-12-12 DIAGNOSIS — Z79899 Other long term (current) drug therapy: Secondary | ICD-10-CM | POA: Diagnosis not present

## 2018-12-12 DIAGNOSIS — I10 Essential (primary) hypertension: Secondary | ICD-10-CM | POA: Diagnosis not present

## 2018-12-12 DIAGNOSIS — I6523 Occlusion and stenosis of bilateral carotid arteries: Secondary | ICD-10-CM | POA: Diagnosis not present

## 2018-12-12 DIAGNOSIS — R634 Abnormal weight loss: Secondary | ICD-10-CM | POA: Diagnosis not present

## 2018-12-12 DIAGNOSIS — K219 Gastro-esophageal reflux disease without esophagitis: Secondary | ICD-10-CM | POA: Diagnosis not present

## 2018-12-12 DIAGNOSIS — Z1239 Encounter for other screening for malignant neoplasm of breast: Secondary | ICD-10-CM | POA: Diagnosis not present

## 2018-12-12 DIAGNOSIS — K449 Diaphragmatic hernia without obstruction or gangrene: Secondary | ICD-10-CM | POA: Diagnosis not present

## 2018-12-12 DIAGNOSIS — Z0000 Encounter for general adult medical examination without abnormal findings: Secondary | ICD-10-CM | POA: Diagnosis not present

## 2018-12-12 DIAGNOSIS — E7800 Pure hypercholesterolemia, unspecified: Secondary | ICD-10-CM | POA: Diagnosis not present

## 2019-01-07 IMAGING — MR MR LUMBAR SPINE W/O CM
5 series · 37 of 48 positions shown · non-contrast
Comparison: Lumbar MRI 11/04/2016

CLINICAL DATA: Neurogenic claudication

EXAM:
MRI LUMBAR SPINE WITHOUT CONTRAST
TECHNIQUE: Multiplanar, multisequence MR imaging of the lumbar spine was
performed. No intravenous contrast was administered.

[Series 2: T2 · sagittal · 4.0mm · 0.81mm/px · 6 of 17 slices shown (1 of 2)]
[im 1/17]
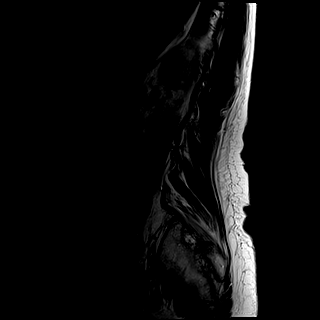
[im 4/17]
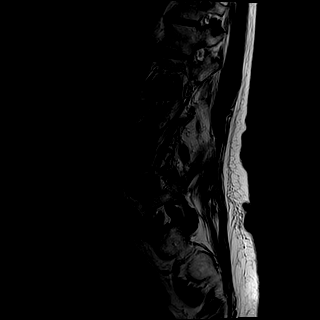
[im 7/17]
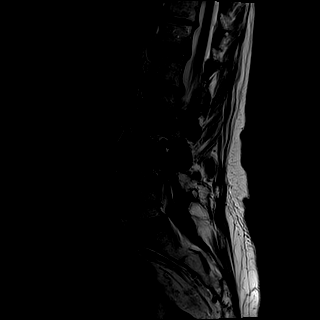
[im 10/17]
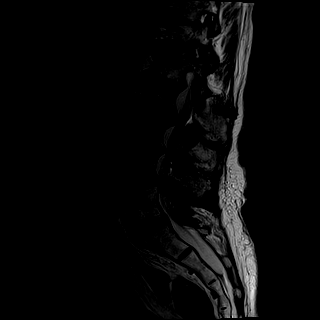
[im 13/17]
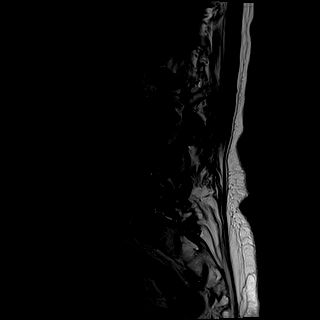
[im 17/17]
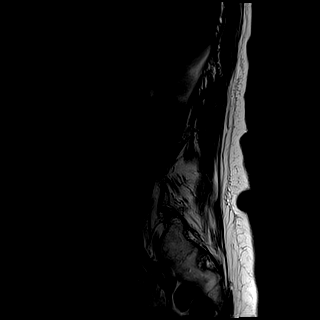

[Series 3: T1 · sagittal · 4.0mm · 0.81mm/px · 7 of 17 slices shown (1 of 2)]
[im 1/17]
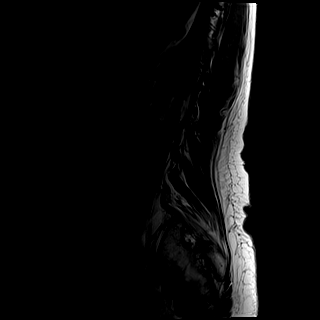
[im 3/17]
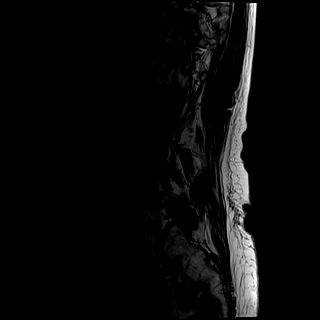
[im 6/17]
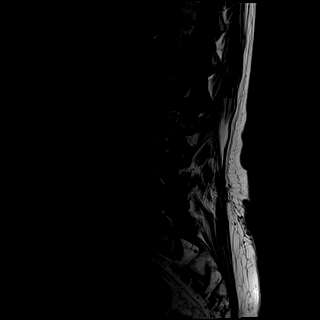
[im 9/17]
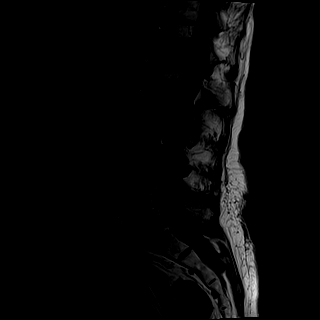
[im 11/17]
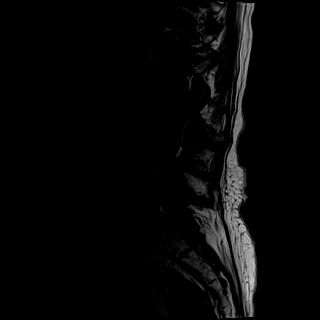
[im 14/17]
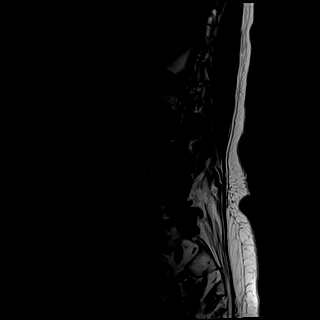
[im 17/17]
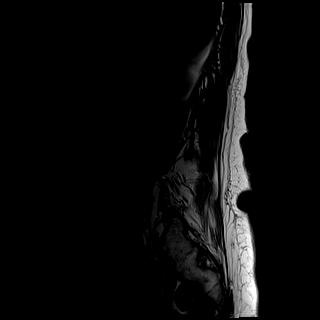

[Series 4: STIR · sagittal · 4.0mm · 1.02mm/px · 7 of 17 slices shown]
[im 1/17]
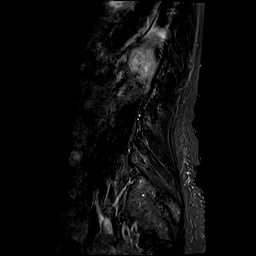
[im 3/17]
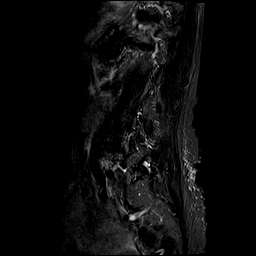
[im 6/17]
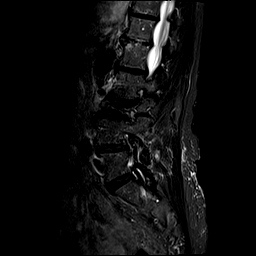
[im 9/17]
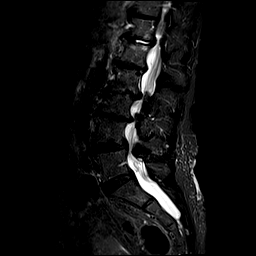
[im 11/17]
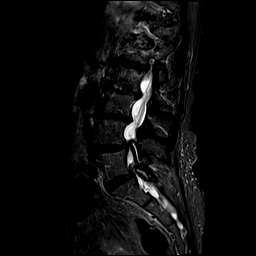
[im 14/17]
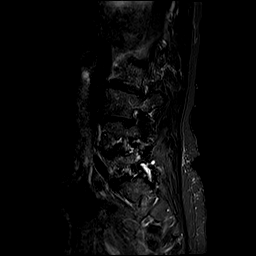
[im 17/17]
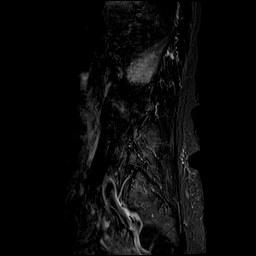

[Series 5: T2 · axial · 4.0mm · 0.78mm/px · z∈[-3,+192]mm · 9 of 36 slices shown (2 of 2)]
[im 1/36]
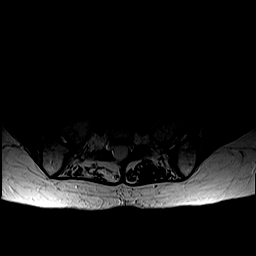
[im 3/36]
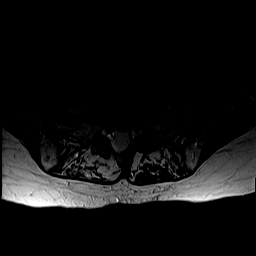
[im 6/36]
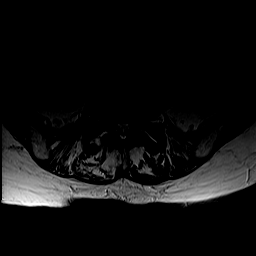
[im 11/36]
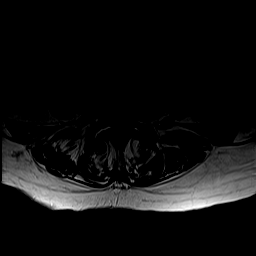
[im 17/36]
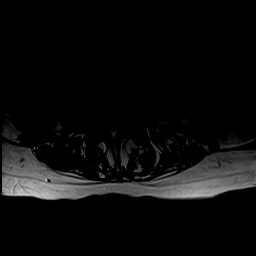
[im 19/36]
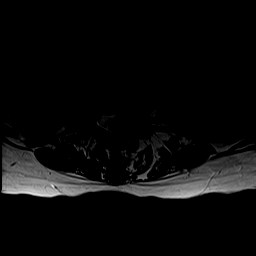
[im 25/36]
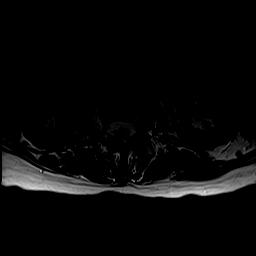
[im 30/36]
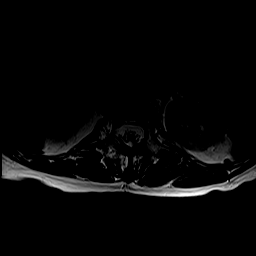
[im 36/36]
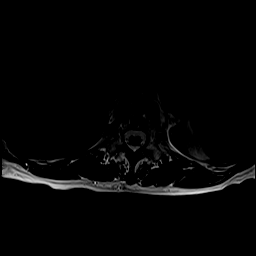

[Series 6: T1 · axial · 4.0mm · 0.39mm/px · z∈[-3,+192]mm · 8 of 36 slices shown (2 of 2)]
[im 1/36]
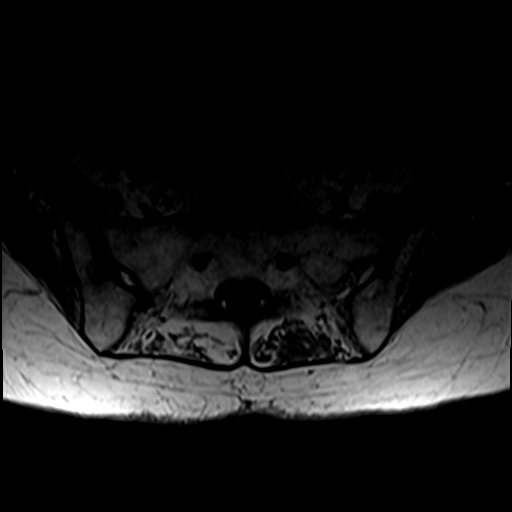
[im 6/36]
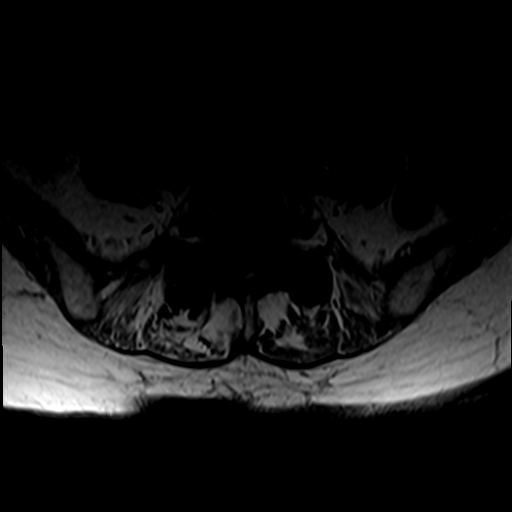
[im 11/36]
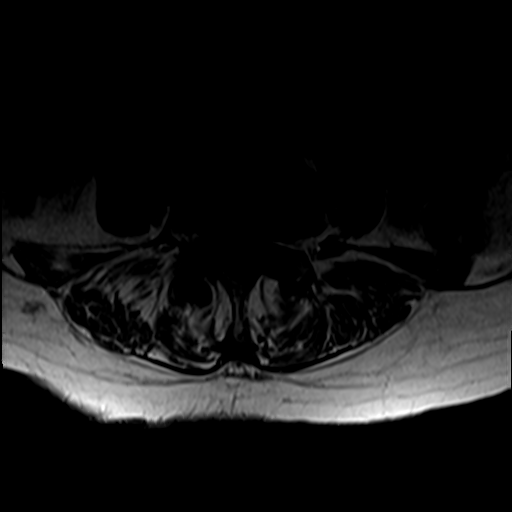
[im 17/36]
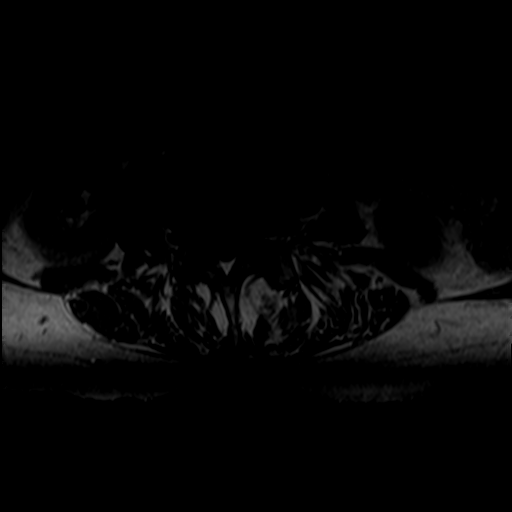
[im 19/36]
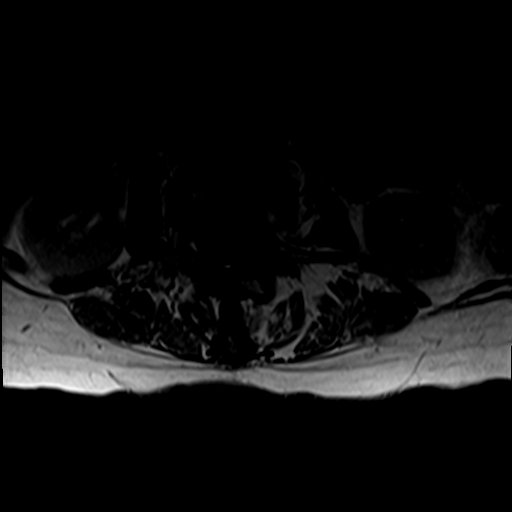
[im 25/36]
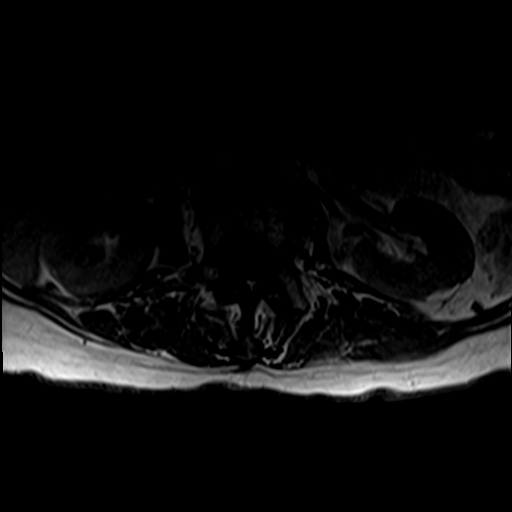
[im 30/36]
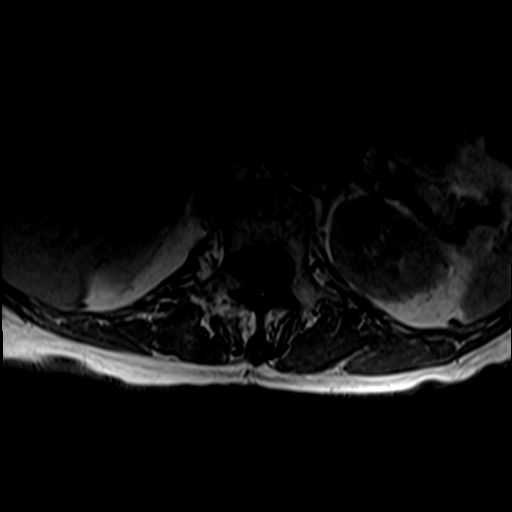
[im 36/36]
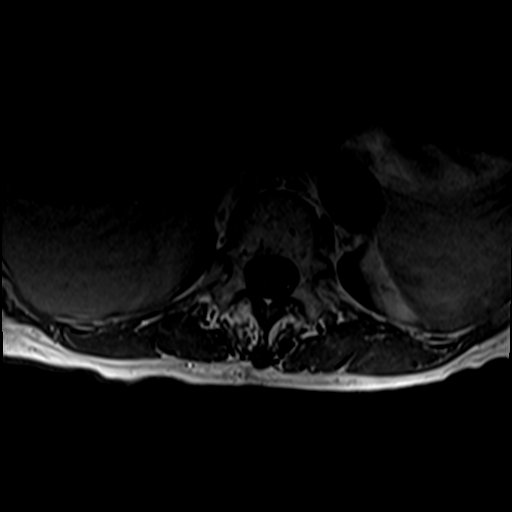

[37 of 48 positions shown; findings below may reference images not displayed]

FINDINGS: Segmentation:  Normal

Alignment: Retrolisthesis L2-3 and L3-4. 4 mm anterolisthesis L4-5.
Lumbar dextroscoliosis.

Vertebrae: Negative for fracture or mass. Discogenic changes are
present the lumbar spine.

Conus medullaris: Extends to the L1-2 level and appears normal.

Paraspinal and other soft tissues: Negative for mass or adenopathy

Disc levels:

T12-L1: Advanced disc degeneration. Interval improvement in the
extruded disc fragment extending caudally to the right of midline.
Bilateral facet degeneration. No significant canal stenosis. Right
foraminal narrowing has improved in the interval.

L1-2: Disc degeneration and spurring right greater than left. Mild
right foraminal narrowing. Spinal canal adequate in size.

L2-3: 5 mm retrolisthesis with advanced disc degeneration and
spurring bilaterally. Moderate facet hypertrophy bilaterally. Severe
subarticular and foraminal stenosis on the left, unchanged from the
prior study. Moderate spinal stenosis also unchanged. Mild right
foraminal narrowing.

L3-4: 4 mm retrolisthesis. Advanced disc degeneration and spurring.
Bilateral facet hypertrophy. Moderate spinal stenosis with mild
progression. Subarticular and foraminal stenosis bilaterally right
greater than left. This is unchanged.

L4-5: 5 mm anterolisthesis with diffuse disc bulging. Severe facet
degeneration right greater than left. Severe spinal stenosis
unchanged. Marked subarticular stenosis bilaterally.

L5-S1: Mild disc degeneration and severe facet degeneration. Mild
subarticular and foraminal stenosis bilaterally.
IMPRESSION: Improvement in extruded disc fragment at T12-L1. Improvement in
right foraminal narrowing.

Moderate spinal stenosis L2-3 unchanged. Severe subarticular
foraminal stenosis on the left unchanged

Moderate spinal stenosis L3-4 with mild progression.

Severe spinal stenosis and severe subarticular stenosis bilaterally
L4-5 unchanged.

Severe facet degeneration L4-5 and L5-S1 unchanged.

## 2019-01-10 ENCOUNTER — Ambulatory Visit
Admission: RE | Admit: 2019-01-10 | Discharge: 2019-01-10 | Disposition: A | Discharge status: Home / Self Care | Payer: PPO | Source: Ambulatory Visit | Attending: Internal Medicine | Admitting: Internal Medicine

## 2019-01-10 DIAGNOSIS — Z1231 Encounter for screening mammogram for malignant neoplasm of breast: Secondary | ICD-10-CM | POA: Diagnosis not present

## 2019-02-09 DIAGNOSIS — H43813 Vitreous degeneration, bilateral: Secondary | ICD-10-CM | POA: Diagnosis not present

## 2019-02-24 DIAGNOSIS — M6281 Muscle weakness (generalized): Secondary | ICD-10-CM | POA: Diagnosis not present

## 2019-02-24 DIAGNOSIS — R2689 Other abnormalities of gait and mobility: Secondary | ICD-10-CM | POA: Diagnosis not present

## 2019-02-24 DIAGNOSIS — Z9181 History of falling: Secondary | ICD-10-CM | POA: Diagnosis not present

## 2019-02-28 DIAGNOSIS — M6281 Muscle weakness (generalized): Secondary | ICD-10-CM | POA: Diagnosis not present

## 2019-02-28 DIAGNOSIS — Z9181 History of falling: Secondary | ICD-10-CM | POA: Diagnosis not present

## 2019-02-28 DIAGNOSIS — R2689 Other abnormalities of gait and mobility: Secondary | ICD-10-CM | POA: Diagnosis not present

## 2019-03-07 DIAGNOSIS — M6281 Muscle weakness (generalized): Secondary | ICD-10-CM | POA: Diagnosis not present

## 2019-03-07 DIAGNOSIS — Z9181 History of falling: Secondary | ICD-10-CM | POA: Diagnosis not present

## 2019-03-07 DIAGNOSIS — R2689 Other abnormalities of gait and mobility: Secondary | ICD-10-CM | POA: Diagnosis not present

## 2019-03-14 DIAGNOSIS — M6281 Muscle weakness (generalized): Secondary | ICD-10-CM | POA: Diagnosis not present

## 2019-03-14 DIAGNOSIS — Z9181 History of falling: Secondary | ICD-10-CM | POA: Diagnosis not present

## 2019-03-14 DIAGNOSIS — R2689 Other abnormalities of gait and mobility: Secondary | ICD-10-CM | POA: Diagnosis not present

## 2019-03-21 DIAGNOSIS — Z9181 History of falling: Secondary | ICD-10-CM | POA: Diagnosis not present

## 2019-03-21 DIAGNOSIS — R2689 Other abnormalities of gait and mobility: Secondary | ICD-10-CM | POA: Diagnosis not present

## 2019-03-21 DIAGNOSIS — M6281 Muscle weakness (generalized): Secondary | ICD-10-CM | POA: Diagnosis not present

## 2019-07-19 DIAGNOSIS — Z79899 Other long term (current) drug therapy: Secondary | ICD-10-CM | POA: Diagnosis not present

## 2019-07-19 DIAGNOSIS — I10 Essential (primary) hypertension: Secondary | ICD-10-CM | POA: Diagnosis not present

## 2019-07-19 DIAGNOSIS — E119 Type 2 diabetes mellitus without complications: Secondary | ICD-10-CM | POA: Diagnosis not present

## 2019-07-26 DIAGNOSIS — E119 Type 2 diabetes mellitus without complications: Secondary | ICD-10-CM | POA: Diagnosis not present

## 2019-07-26 DIAGNOSIS — I10 Essential (primary) hypertension: Secondary | ICD-10-CM | POA: Diagnosis not present

## 2019-07-26 DIAGNOSIS — Z0000 Encounter for general adult medical examination without abnormal findings: Secondary | ICD-10-CM | POA: Diagnosis not present

## 2019-07-26 DIAGNOSIS — R0600 Dyspnea, unspecified: Secondary | ICD-10-CM | POA: Diagnosis not present

## 2019-07-26 DIAGNOSIS — Z79899 Other long term (current) drug therapy: Secondary | ICD-10-CM | POA: Diagnosis not present

## 2019-07-26 DIAGNOSIS — M48061 Spinal stenosis, lumbar region without neurogenic claudication: Secondary | ICD-10-CM | POA: Diagnosis not present

## 2019-07-26 DIAGNOSIS — E7800 Pure hypercholesterolemia, unspecified: Secondary | ICD-10-CM | POA: Diagnosis not present

## 2019-08-10 DIAGNOSIS — R0600 Dyspnea, unspecified: Secondary | ICD-10-CM | POA: Diagnosis not present

## 2019-08-10 DIAGNOSIS — I10 Essential (primary) hypertension: Secondary | ICD-10-CM | POA: Diagnosis not present

## 2019-09-11 DIAGNOSIS — L570 Actinic keratosis: Secondary | ICD-10-CM | POA: Diagnosis not present

## 2019-09-11 DIAGNOSIS — Z1283 Encounter for screening for malignant neoplasm of skin: Secondary | ICD-10-CM | POA: Diagnosis not present

## 2019-09-11 DIAGNOSIS — D2230 Melanocytic nevi of unspecified part of face: Secondary | ICD-10-CM | POA: Diagnosis not present

## 2019-09-11 DIAGNOSIS — L299 Pruritus, unspecified: Secondary | ICD-10-CM | POA: Diagnosis not present

## 2019-09-11 DIAGNOSIS — L821 Other seborrheic keratosis: Secondary | ICD-10-CM | POA: Diagnosis not present

## 2019-09-11 DIAGNOSIS — L820 Inflamed seborrheic keratosis: Secondary | ICD-10-CM | POA: Diagnosis not present

## 2019-10-05 DIAGNOSIS — Z96651 Presence of right artificial knee joint: Secondary | ICD-10-CM | POA: Diagnosis not present

## 2019-10-05 DIAGNOSIS — M1711 Unilateral primary osteoarthritis, right knee: Secondary | ICD-10-CM | POA: Diagnosis not present

## 2019-11-14 ENCOUNTER — Ambulatory Visit (INDEPENDENT_AMBULATORY_CARE_PROVIDER_SITE_OTHER): Payer: PPO | Admitting: Vascular Surgery

## 2019-11-14 ENCOUNTER — Encounter (INDEPENDENT_AMBULATORY_CARE_PROVIDER_SITE_OTHER): Payer: Self-pay

## 2019-11-14 ENCOUNTER — Ambulatory Visit (INDEPENDENT_AMBULATORY_CARE_PROVIDER_SITE_OTHER): Payer: PPO

## 2019-11-14 ENCOUNTER — Encounter (INDEPENDENT_AMBULATORY_CARE_PROVIDER_SITE_OTHER): Payer: Self-pay | Admitting: Vascular Surgery

## 2019-11-14 ENCOUNTER — Other Ambulatory Visit: Payer: Self-pay

## 2019-11-14 VITALS — BP 101/64 | HR 71 | Resp 16 | Wt 139.0 lb

## 2019-11-14 DIAGNOSIS — N951 Menopausal and female climacteric states: Secondary | ICD-10-CM | POA: Insufficient documentation

## 2019-11-14 DIAGNOSIS — M1990 Unspecified osteoarthritis, unspecified site: Secondary | ICD-10-CM | POA: Insufficient documentation

## 2019-11-14 DIAGNOSIS — I6523 Occlusion and stenosis of bilateral carotid arteries: Secondary | ICD-10-CM

## 2019-11-14 DIAGNOSIS — R0609 Other forms of dyspnea: Secondary | ICD-10-CM | POA: Insufficient documentation

## 2019-11-14 DIAGNOSIS — L409 Psoriasis, unspecified: Secondary | ICD-10-CM | POA: Insufficient documentation

## 2019-11-14 DIAGNOSIS — M545 Low back pain, unspecified: Secondary | ICD-10-CM

## 2019-11-14 DIAGNOSIS — D649 Anemia, unspecified: Secondary | ICD-10-CM | POA: Insufficient documentation

## 2019-11-14 DIAGNOSIS — I10 Essential (primary) hypertension: Secondary | ICD-10-CM | POA: Insufficient documentation

## 2019-11-14 DIAGNOSIS — M79605 Pain in left leg: Secondary | ICD-10-CM | POA: Diagnosis not present

## 2019-11-14 DIAGNOSIS — M79609 Pain in unspecified limb: Secondary | ICD-10-CM | POA: Insufficient documentation

## 2019-11-14 DIAGNOSIS — E7800 Pure hypercholesterolemia, unspecified: Secondary | ICD-10-CM | POA: Insufficient documentation

## 2019-11-14 DIAGNOSIS — K219 Gastro-esophageal reflux disease without esophagitis: Secondary | ICD-10-CM

## 2019-11-14 DIAGNOSIS — M79604 Pain in right leg: Secondary | ICD-10-CM | POA: Diagnosis not present

## 2019-11-14 DIAGNOSIS — I493 Ventricular premature depolarization: Secondary | ICD-10-CM | POA: Insufficient documentation

## 2019-11-14 DIAGNOSIS — E119 Type 2 diabetes mellitus without complications: Secondary | ICD-10-CM | POA: Insufficient documentation

## 2019-11-14 DIAGNOSIS — K449 Diaphragmatic hernia without obstruction or gangrene: Secondary | ICD-10-CM | POA: Insufficient documentation

## 2019-11-14 NOTE — Progress Notes (Signed)
MRN : [id number]  Heather Miranda is a 85 y.o. (1929-07-01) female who presents with chief complaint of  Chief Complaint  Patient presents with  . Follow-up    ultrasound follow up  .  History of Present Illness: Patient returns in follow-up of her carotid disease.  She denies any focal neurologic symptoms. Specifically, the patient denies amaurosis fugax, speech or swallowing difficulties, or arm or leg weakness or numbness.  Her carotid duplex today shows stable 40 to 59% right ICA stenosis and 1 to 39% left ICA stenosis. Her biggest complaint today is of continued heaviness and pain in her legs.  She has some difficulty with walking.  There is intermittent swelling.  She has been told she has neuropathy type pain but is concerned about her circulation.  Current Outpatient Medications  Medication Sig Dispense Refill  . aspirin 81 MG chewable tablet Chew by mouth daily.    Marland Kitchen atenolol (TENORMIN) 25 MG tablet Take 25 mg by mouth 2 (two) times daily.     . calcium carbonate (OSCAL) 1500 (600 Ca) MG TABS tablet Take 600 mg of elemental calcium by mouth 2 (two) times daily with a meal.     . fexofenadine (ALLEGRA) 60 MG tablet Take 60 mg by mouth daily as needed for allergies.     Marland Kitchen guaiFENesin (MUCINEX) 600 MG 12 hr tablet Take 600 mg by mouth 2 (two) times daily as needed for cough.     . Magnesium 500 MG CAPS Take by mouth.    . Multiple Vitamins-Minerals (CENTRUM SILVER PO) Take by mouth.    Marland Kitchen omeprazole (PRILOSEC) 20 MG capsule Take 20 mg by mouth 2 (two) times daily before a meal.     . triamcinolone cream (KENALOG) 0.1 %      No current facility-administered medications for this visit.     Past Medical History:  Diagnosis Date  . Arthritis   . Dysrhythmia   . Environmental and seasonal allergies   . GERD (gastroesophageal reflux disease)   . Hernia, hiatal     Past Surgical History:  Procedure Laterality Date  . abd cyst removed    . BACK SURGERY  09/29/2017  . CATARACT  EXTRACTION W/ INTRAOCULAR LENS  IMPLANT, BILATERAL    . DUPUYTREN CONTRACTURE RELEASE Left   . KNEE SURGERY     right  . LUMBAR LAMINECTOMY/DECOMPRESSION MICRODISCECTOMY N/A 09/29/2017   Procedure: LUMBAR LAMINECTOMY/DECOMPRESSION MICRODISCECTOMY 1 LEVEL L4-5;  Surgeon: Meade Maw, MD;  Location: [facility] ORS;  Service: Neurosurgery;  Laterality: N/A;  . ROTATOR CUFF REPAIR Right   . WRIST SURGERY     right    Social History Social History   Tobacco Use  . Smoking status: Never Smoker  . Smokeless tobacco: Never Used  Substance Use Topics  . Alcohol use: Yes    Alcohol/week: 4.0 standard drinks    Types: 4 Glasses of wine per week  . Drug use: No     Family History  Problem Relation Age of Onset  . Breast cancer Mother 59  . Breast cancer Maternal Aunt     Allergies  Allergen Reactions  . Morphine And Related Nausea Only  . Oxycontin [Oxycodone] Nausea Only  . Percocet [Oxycodone-Acetaminophen] Nausea Only  . Morphine Nausea Only  . Oxycodone-Acetaminophen Rash    REVIEW OF SYSTEMS(Negative unless checked)  Constitutional: [] ?Weight loss[] ?Fever[] ?Chills Cardiac:[] ?Chest pain[] ?Chest pressure[x] ?Palpitations [] ?Shortness of breath when laying flat [] ?Shortness of breath at rest [] ?Shortness of breath with exertion. Vascular: [x] ?Pain in  legs with walking[] ?Pain in legsat rest[] ?Pain in legs when laying flat [] ?Claudication [] ?Pain in feet when walking [] ?Pain in feet at rest [] ?Pain in feet when laying flat [] ?History of DVT [] ?Phlebitis [x] ?Swelling in legs [] ?Varicose veins [] ?Non-healing ulcers Pulmonary: [] ?Uses home oxygen [] ?Productive cough[] ?Hemoptysis [] ?Wheeze [] ?COPD [] ?Asthma Neurologic: [] ?Dizziness [] ?Blackouts [] ?Seizures [] ?History of stroke [] ?History of TIA[] ?Aphasia [] ?Temporary blindness[] ?Dysphagia [] ?Weaknessor numbness in arms [] ?Weakness or numbnessin legs  Musculoskeletal: [x] ?Arthritis [] ?Joint swelling [] ?Joint pain [x] ?Low back pain Hematologic:[] ?Easy bruising[] ?Easy bleeding [] ?Hypercoagulable state [] ?Anemic [] ?Hepatitis Gastrointestinal:[] ?Blood in stool[] ?Vomiting blood[x] ?Gastroesophageal reflux/heartburn[] ?Difficulty swallowing. Genitourinary: [] ?Chronic kidney disease [] ?Difficulturination [] ?Frequenturination [] ?Burning with urination[] ?Blood in urine Skin: [] ?Rashes [] ?Ulcers [] ?Wounds Psychological: [] ?History of anxiety[] ?History of major depression.   Physical Examination  Vitals:   11/14/19 1359  BP: 101/64  Pulse: 71  Resp: 16  Weight: 139 lb (63 kg)   Body mass index is 25.42 kg/m. Gen:  WD/WN, NAD.  Appears younger than stated age Head: [place]/AT, No temporalis wasting. Ear/Nose/Throat: Hearing grossly intact, nares w/o erythema or drainage, trachea midline Eyes: Conjunctiva clear. Sclera non-icteric Neck: Supple.  No carotid bruit  Pulmonary:  Good air movement, equal and clear to auscultation bilaterally.  Cardiac: RRR, No JVD Vascular:  Vessel Right Left  Radial Palpable Palpable          DP 1+ 2+  PT 1+ 1+   Musculoskeletal: M/S 5/5 throughout.  No deformity or atrophy. Trace LE edema. Neurologic: CN 2-12 intact. Sensation grossly intact in extremities.  Symmetrical.  Speech is fluent. Motor exam as listed above. Psychiatric: Judgment intact, Mood & affect appropriate for pt's clinical situation. Dermatologic: No rashes or ulcers noted.  No cellulitis or open wounds.      CBC Lab Results  Component Value Date   WBC 5.5 09/21/2017   HGB 13.3 09/21/2017   HCT 39.2 09/21/2017   MCV 91.8 09/21/2017   PLT 238 09/21/2017    BMET    Component Value Date/Time   NA 130 (L) 09/21/2017 0847   K 4.2 09/21/2017 0847   CL 97 (L) 09/21/2017 0847   CO2 25 09/21/2017 0847   GLUCOSE 78 09/21/2017 0847   BUN 16 09/21/2017 0847   CREATININE 0.68 09/21/2017 0847    CALCIUM 9.2 09/21/2017 0847   GFRNONAA >60 09/21/2017 0847   GFRAA >60 09/21/2017 0847   CrCl cannot be calculated (Patient's most recent lab result is older than the maximum 21 days allowed.).  COAG Lab Results  Component Value Date   INR 1.05 09/21/2017    Radiology No results found.   Assessment/Plan Low back pain Improved after back surgery  GERD (gastroesophageal reflux disease) Continue antihypertensive medications as already ordered, these medications have been reviewed and there are no changes at this time. Avoidence of caffeine and alcohol Moderate elevation of the head of the bed  Pain in limb The patient describes nonspecific leg pain of the lower extremities.  Given her age and symptoms, vascular disease could certainly be playing a contributing role.  She does not have any immediate limb threatening symptoms.  Noninvasive studies will be performed in the near future at her convenience.  Carotid stenosis Her carotid duplex today shows stable 40 to 59% right ICA stenosis and 1 to 39% left ICA stenosis. No role for intervention at this level.  No change in her medical regimen.  Recheck in 1 year.    Leotis Pain, MD  11/14/2019 2:36 PM    This note was created with Dragon medical transcription system.  Any errors from dictation are purely  unintentional

## 2019-11-14 NOTE — Assessment & Plan Note (Signed)
Her carotid duplex today shows stable 40 to 59% right ICA stenosis and 1 to 39% left ICA stenosis. No role for intervention at this level.  No change in her medical regimen.  Recheck in 1 year.

## 2019-11-14 NOTE — Assessment & Plan Note (Signed)
The patient describes nonspecific leg pain of the lower extremities.  Given her age and symptoms, vascular disease could certainly be playing a contributing role.  She does not have any immediate limb threatening symptoms.  Noninvasive studies will be performed in the near future at her convenience.

## 2019-11-14 NOTE — Patient Instructions (Signed)
Carotid Artery Disease The carotid arteries are the two main arteries on either side of the neck. They supply blood to the brain, face, and neck. Carotid artery disease, also called carotid artery stenosis, is the narrowing or blockage of one or both carotid arteries. Carotid artery disease increases your risk for a stroke or a transient ischemic attack (TIA). A TIA is an episode in which blood flow to the brain is temporarily blocked. A TIA is considered a "warning stroke." What are the causes? This condition is primarily caused by buildup of plaque inside the carotid arteries (atherosclerosis). What increases the risk? The following factors may make you more likely to develop this condition:  High cholesterol (dyslipidemia).  High blood pressure (hypertension).  Smoking.  Obesity.  Diabetes.  Family history of cardiovascular disease.  Inactivity or lack of regular exercise.  Being female. Men have an increased risk of developing atherosclerosis earlier in life than women.  Old age. What are the signs or symptoms? This condition may not have any signs or symptoms until a stroke or TIA occurs. In some cases, your doctor may be able to hear a whooshing sound (bruit) which can indicate a change in blood flow due to plaque buildup. An eye exam can also help identify signs of the condition. How is this diagnosed? This condition may be diagnosed by:  A physical exam.  Your family and medical history.  Specific tests that look at the blood flow in the carotid arteries. These tests include: ? Carotid artery ultrasound. This test uses sound waves to create pictures of your arteries and show whether they are narrow or blocked. ? Carotid or cerebral angiography. During this test, a special dye will be injected into a vein. The dye will show up on an X-ray to help highlight your arteries. ? Computerized tomographic angiography (CTA). During this test, a special dye will be injected into a  vein. The dye will show up on the CT scan to help highlight your arteries. ? Magnetic resonance angiography (MRA). During this test, a special dye will be injected into a vein. The dye will show up on the MRI to help highlight your arteries. How is this treated? Treatment of this condition may include a combination of treatments. Treatment options include:  Lifestyle changes such as: ? Quitting smoking. ? Exercising regularly or as directed by your health care provider. ? Eating a heart-healthy diet. ? Managing stress. ? Maintaining a healthy weight.  Medicines to control: ? Blood pressure. ? Cholesterol. ? Blood clotting.  Surgery. You may have: ? A carotid endarterectomy. This is a surgery to remove the blockages in the carotid arteries. ? A carotid angioplasty with stenting. This is a procedure in which a wire mesh (stent) is used to widen the blocked carotid arteries. Follow these instructions at home: Lifestyle  Follow your health care provider's instructions about your diet. It is important to eat a healthy diet that is low in saturated fats and includes plenty of fresh fruits, vegetables, and lean meats. You should avoid high-fat, high-sodium foods as well as foods that are fried, overly processed, or have poor nutritional value.  Maintain a healthy weight.  Stay physically active. It is recommended that you get at least 150 minutes of moderate exercise or 75 minutes of strenuous exercise each week.  Do not use any products that contain nicotine or tobacco, such as cigarettes and e-cigarettes. If you need help quitting, ask your health care provider.  Limit alcohol intake to no  more than 1 drink a day for non-pregnant women and 2 drinks a day for men. One drink equals 12 oz. of beer, 5 oz. of wine, or 1 oz. of hard liquor.  Do not use illegal drugs.  Manage your stress. Ask your health care provider for stress management tips. General instructions  Take over-the-counter  and prescription medicines only as told by your health care provider.  Keep all follow-up visits as told by your health care provider. This is important. Get help right away if:  You have any symptoms of stroke or TIA. The acronym BEFAST is an easy way to remember the main warning signs of stroke. ? B = Balance problems. Signs include dizziness, sudden trouble walking, or loss of balance ? E = Eye problems. This includes trouble seeing or a sudden change in vision. ? F = Face changes. This includes sudden weakness or numbness of the face, or the face or eyelid drooping to one side. ? A = Arm weakness or numbness. This happens suddenly and usually on one side of the body. ? S = Speech problems. This includes trouble speaking or trouble understanding speech. ? T = Time. Time to call 911 or seek emergency care. Do not wait to see if symptoms go away. Make note of the time your symptoms started.  Other signs of stroke may include: ? A sudden, severe headache with no known cause. ? Nausea or vomiting. ? Seizure. These symptoms may represent a serious problem that is an emergency. Do not wait to see if the symptoms will go away. Get medical help right away. Call your local emergency services (911 in the [place] Do not drive yourself to the hospital. Summary  Carotid artery disease, also called carotid artery stenosis, is the narrowing or blockage of one or both carotid arteries.  Carotid artery disease increases your risk for a stroke or a transient ischemic attack (TIA).  This condition can be treated with lifestyle changes, medicines, surgery, or a combination of these treatments.  Do not use any products that contain nicotine or tobacco, such as cigarettes and e-cigarettes. If you need help quitting, ask your health care provider. This information is not intended to replace advice given to you by your health care provider. Make sure you discuss any questions you have with your health care  provider. Document Released: 03/07/2012 Document Revised: 11/26/2017 Document Reviewed: 01/18/2017 Elsevier Patient Education  2020 [employer].

## 2019-11-29 ENCOUNTER — Encounter (INDEPENDENT_AMBULATORY_CARE_PROVIDER_SITE_OTHER): Payer: PPO

## 2019-12-04 ENCOUNTER — Other Ambulatory Visit: Payer: Self-pay | Admitting: Internal Medicine

## 2019-12-04 DIAGNOSIS — Z1231 Encounter for screening mammogram for malignant neoplasm of breast: Secondary | ICD-10-CM

## 2019-12-06 ENCOUNTER — Other Ambulatory Visit: Payer: Self-pay

## 2019-12-06 ENCOUNTER — Ambulatory Visit (INDEPENDENT_AMBULATORY_CARE_PROVIDER_SITE_OTHER): Payer: PPO

## 2019-12-06 ENCOUNTER — Ambulatory Visit (INDEPENDENT_AMBULATORY_CARE_PROVIDER_SITE_OTHER): Payer: PPO | Admitting: Nurse Practitioner

## 2019-12-06 ENCOUNTER — Encounter (INDEPENDENT_AMBULATORY_CARE_PROVIDER_SITE_OTHER): Payer: Self-pay | Admitting: Nurse Practitioner

## 2019-12-06 VITALS — BP 181/73 | HR 66 | Resp 16 | Ht 60.5 in | Wt 135.0 lb

## 2019-12-06 DIAGNOSIS — M79605 Pain in left leg: Secondary | ICD-10-CM

## 2019-12-06 DIAGNOSIS — M79604 Pain in right leg: Secondary | ICD-10-CM | POA: Diagnosis not present

## 2019-12-06 DIAGNOSIS — I6523 Occlusion and stenosis of bilateral carotid arteries: Secondary | ICD-10-CM | POA: Diagnosis not present

## 2019-12-06 DIAGNOSIS — K219 Gastro-esophageal reflux disease without esophagitis: Secondary | ICD-10-CM

## 2019-12-10 ENCOUNTER — Encounter (INDEPENDENT_AMBULATORY_CARE_PROVIDER_SITE_OTHER): Payer: Self-pay | Admitting: Nurse Practitioner

## 2019-12-10 NOTE — Progress Notes (Signed)
SUBJECTIVE:  Patient ID: Heather Miranda, female    DOB: June 29, 1929, 85 y.o.   MRN: [id number] Chief Complaint  Patient presents with  . Follow-up    HPI  Heather Miranda is a 85 y.o. female that presents today with concerns of continued heaviness and pain in her lower extremities.  She also experiences some difficulty with ambulation.  She has some swelling.  She also has some coolness of her lower extremities.  She has no neuropathy however is concerned about possible issues with circulation.  The patient was previously seen for her carotid artery stenosis which was still mild.  She denies any fever, chills, nausea, vomiting or diarrhea.  She denies any chest pain or shortness of breath.  She denies any rest pain like symptoms.  Past Medical History:  Diagnosis Date  . Arthritis   . Dysrhythmia   . Environmental and seasonal allergies   . GERD (gastroesophageal reflux disease)   . Hernia, hiatal     Past Surgical History:  Procedure Laterality Date  . abd cyst removed    . BACK SURGERY  09/29/2017  . CATARACT EXTRACTION W/ INTRAOCULAR LENS  IMPLANT, BILATERAL    . DUPUYTREN CONTRACTURE RELEASE Left   . KNEE SURGERY     right  . LUMBAR LAMINECTOMY/DECOMPRESSION MICRODISCECTOMY N/A 09/29/2017   Procedure: LUMBAR LAMINECTOMY/DECOMPRESSION MICRODISCECTOMY 1 LEVEL L4-5;  Surgeon: Meade Maw, MD;  Location: [facility] ORS;  Service: Neurosurgery;  Laterality: N/A;  . ROTATOR CUFF REPAIR Right   . WRIST SURGERY     right    Social History   Socioeconomic History  . Marital status: Widowed    Spouse name: Not on file  . Number of children: Not on file  . Years of education: Not on file  . Highest education level: Not on file  Occupational History  . Not on file  Tobacco Use  . Smoking status: Never Smoker  . Smokeless tobacco: Never Used  Substance and Sexual Activity  . Alcohol use: Yes    Alcohol/week: 4.0 standard drinks    Types: 4 Glasses of wine per week  . Drug  use: No  . Sexual activity: Not on file  Other Topics Concern  . Not on file  Social History Narrative  . Not on file   Social Determinants of Health   Financial Resource Strain:   . Difficulty of Paying Living Expenses: Not on file  Food Insecurity:   . Worried About Charity fundraiser in the Last Year: Not on file  . Ran Out of Food in the Last Year: Not on file  Transportation Needs:   . Lack of Transportation (Medical): Not on file  . Lack of Transportation (Non-Medical): Not on file  Physical Activity:   . Days of Exercise per Week: Not on file  . Minutes of Exercise per Session: Not on file  Stress:   . Feeling of Stress : Not on file  Social Connections:   . Frequency of Communication with Friends and Family: Not on file  . Frequency of Social Gatherings with Friends and Family: Not on file  . Attends Religious Services: Not on file  . Active Member of Clubs or Organizations: Not on file  . Attends Archivist Meetings: Not on file  . Marital Status: Not on file  Intimate Partner Violence:   . Fear of Current or Ex-Partner: Not on file  . Emotionally Abused: Not on file  . Physically Abused: Not on file  .  Sexually Abused: Not on file    Family History  Problem Relation Age of Onset  . Breast cancer Mother 24  . Breast cancer Maternal Aunt     Allergies  Allergen Reactions  . Morphine And Related Nausea Only  . Oxycontin [Oxycodone] Nausea Only  . Percocet [Oxycodone-Acetaminophen] Nausea Only  . Morphine Nausea Only  . Oxycodone-Acetaminophen Rash     Review of Systems   Review of Systems: Negative Unless Checked Constitutional: [] Weight loss  [] Fever  [] Chills Cardiac: [] Chest pain   []  Atrial Fibrillation  [] Palpitations   [] Shortness of breath when laying flat   [] Shortness of breath with exertion. [] Shortness of breath at rest Vascular:  [] Pain in legs with walking   [] Pain in legs with standing [] Pain in legs when laying flat    [] Claudication    [] Pain in feet when laying flat    [] History of DVT   [] Phlebitis   [] Swelling in legs   [] Varicose veins   [] Non-healing ulcers Pulmonary:   [] Uses home oxygen   [] Productive cough   [] Hemoptysis   [] Wheeze  [] COPD   [] Asthma Neurologic:  [] Dizziness   [] Seizures  [] Blackouts [] History of stroke   [] History of TIA  [] Aphasia   [] Temporary Blindness   [] Weakness or numbness in arm   [] Weakness or numbness in leg Musculoskeletal:   [] Joint swelling   [] Joint pain   [x] Low back pain  []  History of Knee Replacement [x] Arthritis [] back Surgeries  [x]  Spinal Stenosis    Hematologic:  [] Easy bruising  [] Easy bleeding   [] Hypercoagulable state   [x] Anemic Gastrointestinal:  [] Diarrhea   [] Vomiting  [x] Gastroesophageal reflux/heartburn   [] Difficulty swallowing. [] Abdominal pain Genitourinary:  [] Chronic kidney disease   [] Difficult urination  [] Anuric   [] Blood in urine [] Frequent urination  [] Burning with urination   [] Hematuria Skin:  [] Rashes   [] Ulcers [] Wounds Psychological:  [] History of anxiety   []  History of major depression  []  Memory Difficulties      OBJECTIVE:   Physical Exam  BP (!) 181/73 (BP Location: Left Arm)   Pulse 66   Resp 16   Ht 5' 0.5" (1.537 m)   Wt 135 lb (61.2 kg)   BMI 25.93 kg/m   Gen: WD/WN, NAD Head: [place]/AT, No temporalis wasting.  Ear/Nose/Throat: Hearing grossly intact, nares w/o erythema or drainage Eyes: PER, EOMI, sclera nonicteric.  Neck: Supple, no masses.  No JVD.  Pulmonary:  Good air movement, no use of accessory muscles.  Cardiac: RRR Vascular:  Vessel Right Left  Dorsalis Pedis Palpable Palpable  Posterior Tibial Palpable Palpable   Gastrointestinal: soft, non-distended. No guarding/no peritoneal signs.  Musculoskeletal: M/S 5/5 throughout.  No deformity or atrophy.  Neurologic: Pain and light touch intact in extremities.  Symmetrical.  Speech is fluent. Motor exam as listed above. Psychiatric: Judgment intact, Mood & affect  appropriate for pt's clinical situation. Dermatologic: No Venous rashes. No Ulcers Noted.  No changes consistent with cellulitis. Lymph : No Cervical lymphadenopathy, no lichenification or skin changes of chronic lymphedema.       ASSESSMENT AND PLAN:  1. Bilateral carotid artery stenosis Patient will continue with her annual follow-up schedule.  2. Pain in both lower extremities Recommend:  I do not find evidence of Vascular pathology that would explain the patient's symptoms  The patient has atypical pain symptoms for vascular disease  I do not find evidence of Vascular pathology that would explain the patient's symptoms and I suspect the patient is c/o pseudoclaudication.  Patient  should have an evaluation of his LS spine which I defer to the primary service.  The patient should continue walking and begin a more formal exercise program. The patient should continue his antiplatelet therapy and aggressive treatment of the lipid abnormalities. The patient should begin wearing graduated compression socks 15-20 mmHg strength to control her mild edema.  Patient will follow-up with me on a PRN basis  Further work-up of her lower extremity pain is deferred to the primary service     3. Gastroesophageal reflux disease, unspecified whether esophagitis present Continue PPI as already ordered, this medication has been reviewed and there are no changes at this time.  Avoidence of caffeine and alcohol  Moderate elevation of the head of the bed    Current Outpatient Medications on File Prior to Visit  Medication Sig Dispense Refill  . aspirin 81 MG chewable tablet Chew by mouth daily.    Marland Kitchen atenolol (TENORMIN) 25 MG tablet Take 25 mg by mouth 2 (two) times daily.     . calcium carbonate (OSCAL) 1500 (600 Ca) MG TABS tablet Take 600 mg of elemental calcium by mouth 2 (two) times daily with a meal.     . fexofenadine (ALLEGRA) 60 MG tablet Take 60 mg by mouth daily as needed for allergies.      . Magnesium 500 MG CAPS Take by mouth.    . Multiple Vitamins-Minerals (CENTRUM SILVER PO) Take by mouth.    Marland Kitchen omeprazole (PRILOSEC) 20 MG capsule Take 20 mg by mouth 2 (two) times daily before a meal.     . triamcinolone cream (KENALOG) 0.1 %     . guaiFENesin (MUCINEX) 600 MG 12 hr tablet Take 600 mg by mouth 2 (two) times daily as needed for cough.      No current facility-administered medications on file prior to visit.    There are no Patient Instructions on file for this visit. No follow-ups on file.   Kris Hartmann, NP  This note was completed with Sales executive.  Any errors are purely unintentional.

## 2020-01-12 ENCOUNTER — Ambulatory Visit
Admission: RE | Admit: 2020-01-12 | Discharge: 2020-01-12 | Disposition: A | Discharge status: Home / Self Care | Payer: PPO | Source: Ambulatory Visit | Attending: Internal Medicine | Admitting: Internal Medicine

## 2020-01-12 DIAGNOSIS — Z1231 Encounter for screening mammogram for malignant neoplasm of breast: Secondary | ICD-10-CM | POA: Diagnosis not present

## 2020-01-18 DIAGNOSIS — E119 Type 2 diabetes mellitus without complications: Secondary | ICD-10-CM | POA: Diagnosis not present

## 2020-01-18 DIAGNOSIS — E7800 Pure hypercholesterolemia, unspecified: Secondary | ICD-10-CM | POA: Diagnosis not present

## 2020-01-18 DIAGNOSIS — I10 Essential (primary) hypertension: Secondary | ICD-10-CM | POA: Diagnosis not present

## 2020-01-18 DIAGNOSIS — Z79899 Other long term (current) drug therapy: Secondary | ICD-10-CM | POA: Diagnosis not present

## 2020-02-02 DIAGNOSIS — Z79899 Other long term (current) drug therapy: Secondary | ICD-10-CM | POA: Diagnosis not present

## 2020-02-02 DIAGNOSIS — E119 Type 2 diabetes mellitus without complications: Secondary | ICD-10-CM | POA: Diagnosis not present

## 2020-02-02 DIAGNOSIS — I10 Essential (primary) hypertension: Secondary | ICD-10-CM | POA: Diagnosis not present

## 2020-02-02 DIAGNOSIS — Z0000 Encounter for general adult medical examination without abnormal findings: Secondary | ICD-10-CM | POA: Diagnosis not present

## 2020-02-02 DIAGNOSIS — E7800 Pure hypercholesterolemia, unspecified: Secondary | ICD-10-CM | POA: Diagnosis not present

## 2020-02-12 DIAGNOSIS — D3132 Benign neoplasm of left choroid: Secondary | ICD-10-CM | POA: Diagnosis not present

## 2020-05-07 DIAGNOSIS — B351 Tinea unguium: Secondary | ICD-10-CM | POA: Diagnosis not present

## 2020-05-07 DIAGNOSIS — M79675 Pain in left toe(s): Secondary | ICD-10-CM | POA: Diagnosis not present

## 2020-05-07 DIAGNOSIS — L600 Ingrowing nail: Secondary | ICD-10-CM | POA: Diagnosis not present

## 2020-05-07 DIAGNOSIS — M79674 Pain in right toe(s): Secondary | ICD-10-CM | POA: Diagnosis not present

## 2020-07-24 DIAGNOSIS — I10 Essential (primary) hypertension: Secondary | ICD-10-CM | POA: Diagnosis not present

## 2020-07-24 DIAGNOSIS — Z79899 Other long term (current) drug therapy: Secondary | ICD-10-CM | POA: Diagnosis not present

## 2020-07-24 DIAGNOSIS — E119 Type 2 diabetes mellitus without complications: Secondary | ICD-10-CM | POA: Diagnosis not present

## 2020-07-24 DIAGNOSIS — R8290 Unspecified abnormal findings in urine: Secondary | ICD-10-CM | POA: Diagnosis not present

## 2020-07-24 DIAGNOSIS — E7800 Pure hypercholesterolemia, unspecified: Secondary | ICD-10-CM | POA: Diagnosis not present

## 2020-07-31 DIAGNOSIS — E119 Type 2 diabetes mellitus without complications: Secondary | ICD-10-CM | POA: Diagnosis not present

## 2020-07-31 DIAGNOSIS — I10 Essential (primary) hypertension: Secondary | ICD-10-CM | POA: Diagnosis not present

## 2020-07-31 DIAGNOSIS — E7800 Pure hypercholesterolemia, unspecified: Secondary | ICD-10-CM | POA: Diagnosis not present

## 2020-07-31 DIAGNOSIS — M48061 Spinal stenosis, lumbar region without neurogenic claudication: Secondary | ICD-10-CM | POA: Diagnosis not present

## 2020-07-31 DIAGNOSIS — Z79899 Other long term (current) drug therapy: Secondary | ICD-10-CM | POA: Diagnosis not present

## 2020-09-06 DIAGNOSIS — Z20828 Contact with and (suspected) exposure to other viral communicable diseases: Secondary | ICD-10-CM | POA: Diagnosis not present

## 2020-09-12 ENCOUNTER — Other Ambulatory Visit: Payer: Self-pay

## 2020-09-12 ENCOUNTER — Ambulatory Visit: Payer: PPO | Admitting: Dermatology

## 2020-09-12 DIAGNOSIS — D485 Neoplasm of uncertain behavior of skin: Secondary | ICD-10-CM | POA: Diagnosis not present

## 2020-09-12 DIAGNOSIS — D225 Melanocytic nevi of trunk: Secondary | ICD-10-CM | POA: Diagnosis not present

## 2020-09-12 DIAGNOSIS — L578 Other skin changes due to chronic exposure to nonionizing radiation: Secondary | ICD-10-CM | POA: Diagnosis not present

## 2020-09-12 DIAGNOSIS — D1800 Hemangioma unspecified site: Secondary | ICD-10-CM

## 2020-09-12 DIAGNOSIS — L821 Other seborrheic keratosis: Secondary | ICD-10-CM

## 2020-09-12 DIAGNOSIS — Z1283 Encounter for screening for malignant neoplasm of skin: Secondary | ICD-10-CM | POA: Diagnosis not present

## 2020-09-12 DIAGNOSIS — D229 Melanocytic nevi, unspecified: Secondary | ICD-10-CM | POA: Diagnosis not present

## 2020-09-12 DIAGNOSIS — L570 Actinic keratosis: Secondary | ICD-10-CM

## 2020-09-12 DIAGNOSIS — L814 Other melanin hyperpigmentation: Secondary | ICD-10-CM | POA: Diagnosis not present

## 2020-09-12 NOTE — Progress Notes (Signed)
Follow-Up Visit   Subjective  Heather Miranda is a 86 y.o. female who presents for the following: Annual Exam (TBSE today - History of AK). The patient presents for Total-Body Skin Exam (TBSE) for skin cancer screening and mole check.  The following portions of the chart were reviewed this encounter and updated as appropriate:  Tobacco  Allergies  Meds  Problems  Med Hx  Surg Hx  Fam Hx     Review of Systems:  No other skin or systemic complaints except as noted in HPI or Assessment and Plan.  Objective  Well appearing patient in no apparent distress; mood and affect are within normal limits.  A full examination was performed including scalp, head, eyes, ears, nose, lips, neck, chest, axillae, abdomen, back, buttocks, bilateral upper extremities, bilateral lower extremities, hands, feet, fingers, toes, fingernails, and toenails. All findings within normal limits unless otherwise noted below.  Objective  Right cheek: Erythematous thin papules/macules with gritty scale.   Objective  Left mid back paraspinal: 0.6 cm irregular brown macule   Assessment & Plan    Lentigines - Scattered tan macules - Discussed due to sun exposure - Benign, observe - Call for any changes  Seborrheic Keratoses - Stuck-on, waxy, tan-brown papules and plaques  - Discussed benign etiology and prognosis. - Observe - Call for any changes  Melanocytic Nevi - Tan-brown and/or pink-flesh-colored symmetric macules and papules - Benign appearing on exam today - Observation - Call clinic for new or changing moles - Recommend daily use of broad spectrum spf 30+ sunscreen to sun-exposed areas.   Hemangiomas - Red papules - Discussed benign nature - Observe - Call for any changes  Actinic Damage - diffuse scaly erythematous macules with underlying dyspigmentation - Recommend daily broad spectrum sunscreen SPF 30+ to sun-exposed areas, reapply every 2 hours as needed.  - Call for new or  changing lesions.  Skin cancer screening performed today.   AK (actinic keratosis) Right cheek  Destruction of lesion - Right cheek Complexity: simple   Destruction method: cryotherapy   Informed consent: discussed and consent obtained   Timeout:  patient name, date of birth, surgical site, and procedure verified Lesion destroyed using liquid nitrogen: Yes   Region frozen until ice ball extended beyond lesion: Yes   Outcome: patient tolerated procedure well with no complications   Post-procedure details: wound care instructions given    Neoplasm of uncertain behavior of skin Left mid back paraspinal  Epidermal / dermal shaving  Lesion diameter (cm):  0.6 Informed consent: discussed and consent obtained   Timeout: patient name, date of birth, surgical site, and procedure verified   Procedure prep:  Patient was prepped and draped in usual sterile fashion Prep type:  Isopropyl alcohol Anesthesia: the lesion was anesthetized in a standard fashion   Anesthetic:  1% lidocaine w/ epinephrine 1-100,000 buffered w/ 8.4% NaHCO3 Instrument used: flexible razor blade   Hemostasis achieved with: pressure, aluminum chloride and electrodesiccation   Outcome: patient tolerated procedure well   Post-procedure details: sterile dressing applied and wound care instructions given   Dressing type: bandage and petrolatum    Specimen 1 - Surgical pathology Differential Diagnosis: Nevus vs dysplastic nevus Check Margins: No 0.6 cm irregular brown macule  Return in about 1 year (around 09/12/2021).   I, Ashok Cordia, CMA, am acting as scribe for Sarina Ser, MD .  Documentation: I have reviewed the above documentation for accuracy and completeness, and I agree with the above.  Sarina Ser,  MD

## 2020-09-12 NOTE — Patient Instructions (Signed)

## 2020-09-16 ENCOUNTER — Encounter: Payer: Self-pay | Admitting: Dermatology

## 2020-09-18 ENCOUNTER — Telehealth: Payer: Self-pay

## 2020-09-18 NOTE — Telephone Encounter (Signed)
-----   Message from Ralene Bathe, MD sent at 09/17/2020  7:11 PM EDT ----- Skin , left mid back paraspinal DYSPLASTIC JUNCTIONAL NEVUS WITH MODERATE ATYPIA, LIMITED MARGINS FREE  Dysplastic Moderate Recheck next visit

## 2020-09-18 NOTE — Telephone Encounter (Signed)
Pt informed of results. She had no concerns. 

## 2020-09-18 NOTE — Telephone Encounter (Signed)
Lft pt msg to call for bx results/sh °

## 2020-09-20 DIAGNOSIS — Z20828 Contact with and (suspected) exposure to other viral communicable diseases: Secondary | ICD-10-CM | POA: Diagnosis not present

## 2020-11-19 ENCOUNTER — Encounter (INDEPENDENT_AMBULATORY_CARE_PROVIDER_SITE_OTHER): Payer: Self-pay | Admitting: Vascular Surgery

## 2020-11-19 ENCOUNTER — Other Ambulatory Visit: Payer: Self-pay

## 2020-11-19 ENCOUNTER — Ambulatory Visit (INDEPENDENT_AMBULATORY_CARE_PROVIDER_SITE_OTHER): Payer: PPO

## 2020-11-19 ENCOUNTER — Ambulatory Visit (INDEPENDENT_AMBULATORY_CARE_PROVIDER_SITE_OTHER): Payer: PPO | Admitting: Vascular Surgery

## 2020-11-19 VITALS — BP 166/72 | HR 68 | Resp 16 | Wt 134.6 lb

## 2020-11-19 DIAGNOSIS — M545 Low back pain, unspecified: Secondary | ICD-10-CM | POA: Diagnosis not present

## 2020-11-19 DIAGNOSIS — K219 Gastro-esophageal reflux disease without esophagitis: Secondary | ICD-10-CM

## 2020-11-19 DIAGNOSIS — E119 Type 2 diabetes mellitus without complications: Secondary | ICD-10-CM | POA: Diagnosis not present

## 2020-11-19 DIAGNOSIS — I6523 Occlusion and stenosis of bilateral carotid arteries: Secondary | ICD-10-CM

## 2020-11-19 DIAGNOSIS — I10 Essential (primary) hypertension: Secondary | ICD-10-CM | POA: Diagnosis not present

## 2020-11-19 NOTE — Assessment & Plan Note (Signed)
blood pressure control important in reducing the progression of atherosclerotic disease. On appropriate oral medications.  

## 2020-11-19 NOTE — Progress Notes (Signed)
MRN : [id number]  Heather Miranda is a 86 y.o. ([birth year]) female who presents with chief complaint of  Chief Complaint  Patient presents with  . Follow-up    ultrasound follow up  .  History of Present Illness: patient returns in follow up of her carotid disease. She is doing well. No complaints today. Carotid duplex today reveals stable disease in the 40-59% range on the right and 1-39% on the left.  Current Outpatient Medications  Medication Sig Dispense Refill  . aspirin 81 MG chewable tablet Chew by mouth daily.    Marland Kitchen atenolol (TENORMIN) 25 MG tablet Take 25 mg by mouth 2 (two) times daily.     . calcium carbonate (OSCAL) 1500 (600 Ca) MG TABS tablet Take 600 mg of elemental calcium by mouth 2 (two) times daily with a meal.     . Cholecalciferol (D3-1000) 25 MCG (1000 UT) capsule Take 1,000 Units by mouth daily.    . fexofenadine (ALLEGRA) 60 MG tablet Take 60 mg by mouth daily as needed for allergies.     Marland Kitchen guaiFENesin (MUCINEX) 600 MG 12 hr tablet Take 600 mg by mouth 2 (two) times daily as needed for cough.     . Magnesium 500 MG CAPS Take by mouth.    . Multiple Vitamins-Minerals (CENTRUM SILVER PO) Take by mouth.    Marland Kitchen omeprazole (PRILOSEC) 20 MG capsule Take 20 mg by mouth 2 (two) times daily before a meal.     . triamcinolone cream (KENALOG) 0.1 %      No current facility-administered medications for this visit.    Past Medical History:  Diagnosis Date  . Arthritis   . Dysrhythmia   . Environmental and seasonal allergies   . GERD (gastroesophageal reflux disease)   . Hernia, hiatal   . Hx of dysplastic nevus 09/13/2015   L mid back paraspinal, moderate atypia    Past Surgical History:  Procedure Laterality Date  . abd cyst removed    . BACK SURGERY  09/29/2017  . CATARACT EXTRACTION W/ INTRAOCULAR LENS  IMPLANT, BILATERAL    . DUPUYTREN CONTRACTURE RELEASE Left   . KNEE SURGERY     right  . LUMBAR LAMINECTOMY/DECOMPRESSION MICRODISCECTOMY N/A 09/29/2017    Procedure: LUMBAR LAMINECTOMY/DECOMPRESSION MICRODISCECTOMY 1 LEVEL L4-5;  Surgeon: Meade Maw, MD;  Location: [facility] ORS;  Service: Neurosurgery;  Laterality: N/A;  . ROTATOR CUFF REPAIR Right   . WRIST SURGERY     right     Social History   Tobacco Use  . Smoking status: Never Smoker  . Smokeless tobacco: Never Used  Vaping Use  . Vaping Use: Never used  Substance Use Topics  . Alcohol use: Yes    Alcohol/week: 4.0 standard drinks    Types: 4 Glasses of wine per week  . Drug use: No      Family History  Problem Relation Age of Onset  . Breast cancer Mother 108  . Breast cancer Maternal Aunt      Allergies  Allergen Reactions  . Morphine And Related Nausea Only  . Oxycontin [Oxycodone] Nausea Only  . Percocet [Oxycodone-Acetaminophen] Nausea Only  . Morphine Nausea Only  . Oxycodone-Acetaminophen Rash    REVIEW OF SYSTEMS(Negative unless checked)  Constitutional: [] ??Weight loss[] ??Fever[] ??Chills Cardiac:[] ??Chest pain[] ??Chest pressure[x] ??Palpitations [] ??Shortness of breath when laying flat [] ??Shortness of breath at rest [] ??Shortness of breath with exertion. Vascular: [x] ??Pain in legs with walking[] ??Pain in legsat rest[] ??Pain in legs when laying flat [] ??Claudication [] ??Pain in feet when walking [] ??Pain  in feet at rest [] ??Pain in feet when laying flat [] ??History of DVT [] ??Phlebitis [x] ??Swelling in legs [] ??Varicose veins [] ??Non-healing ulcers Pulmonary: [] ??Uses home oxygen [] ??Productive cough[] ??Hemoptysis [] ??Wheeze [] ??COPD [] ??Asthma Neurologic: [] ??Dizziness [] ??Blackouts [] ??Seizures [] ??History of stroke [] ??History of TIA[] ??Aphasia [] ??Temporary blindness[] ??Dysphagia [] ??Weaknessor numbness in arms [] ??Weakness or numbnessin legs Musculoskeletal: [x] ??Arthritis [] ??Joint swelling [] ??Joint pain [x] ??Low back pain Hematologic:[] ??Easy bruising[] ??Easy  bleeding [] ??Hypercoagulable state [] ??Anemic [] ??Hepatitis Gastrointestinal:[] ??Blood in stool[] ??Vomiting blood[x] ??Gastroesophageal reflux/heartburn[] ??Difficulty swallowing. Genitourinary: [] ??Chronic kidney disease [] ??Difficulturination [] ??Frequenturination [] ??Burning with urination[] ??Blood in urine Skin: [] ??Rashes [] ??Ulcers [] ??Wounds Psychological: [] ??History of anxiety[] ??History of major depression.  Physical Examination  Vitals:   11/19/20 1335  BP: (!) 166/72  Pulse: 68  Resp: 16  Weight: 134 lb 9.6 oz (61.1 kg)   Body mass index is 25.85 kg/m. Gen:  WD/WN, NAD. Appears younger than stated age. Head: [place]/AT, No temporalis wasting. Ear/Nose/Throat: Hearing grossly intact, nares w/o erythema or drainage, trachea midline Eyes: Conjunctiva clear. Sclera non-icteric Neck: Supple.  No bruit  Pulmonary:  Good air movement, equal and clear to auscultation bilaterally.  Cardiac: RRR, No JVD Vascular:  Vessel Right Left  Radial Palpable Palpable           Musculoskeletal: M/S 5/5 throughout.  No deformity or atrophy. No edema. Neurologic: CN 2-12 intact. Sensation grossly intact in extremities.  Symmetrical.  Speech is fluent. Motor exam as listed above. Psychiatric: Judgment intact, Mood & affect appropriate for pt's clinical situation. Dermatologic: No rashes or ulcers noted.  No cellulitis or open wounds.    CBC Lab Results  Component Value Date   WBC 5.5 09/21/2017   HGB 13.3 09/21/2017   HCT 39.2 09/21/2017   MCV 91.8 09/21/2017   PLT 238 09/21/2017    BMET    Component Value Date/Time   NA 130 (L) 09/21/2017 0847   K 4.2 09/21/2017 0847   CL 97 (L) 09/21/2017 0847   CO2 25 09/21/2017 0847   GLUCOSE 78 09/21/2017 0847   BUN 16 09/21/2017 0847   CREATININE 0.68 09/21/2017 0847   CALCIUM 9.2 09/21/2017 0847   GFRNONAA >60 09/21/2017 0847   GFRAA >60 09/21/2017 0847   CrCl cannot be calculated (Patient's most  recent lab result is older than the maximum 21 days allowed.).  COAG Lab Results  Component Value Date   INR 1.05 09/21/2017    Radiology No results found.   Assessment/Plan Low back pain Improved after back surgery  GERD (gastroesophageal reflux disease) Continue antihypertensive medications as already ordered, these medications have been reviewed and there are no changes at this time. Avoidence of caffeine and alcohol Moderate elevation of the head of the bed  Benign essential hypertension blood pressure control important in reducing the progression of atherosclerotic disease. On appropriate oral medications.     Type 2 diabetes mellitus without complication (HCC) blood glucose control important in reducing the progression of atherosclerotic disease. Also, involved in wound healing. On appropriate medications.   Carotid stenosis Carotid duplex today reveals stable disease in the 40-59% range on the right and 1-39% on the left. No changes at this time.  Check annually.    Leotis Pain, MD  11/19/2020 1:53 PM    This note was created with Dragon medical transcription system.  Any errors from dictation are purely unintentional

## 2020-11-19 NOTE — Assessment & Plan Note (Signed)
blood glucose control important in reducing the progression of atherosclerotic disease. Also, involved in wound healing. On appropriate medications.  

## 2020-11-19 NOTE — Assessment & Plan Note (Signed)
Carotid duplex today reveals stable disease in the 40-59% range on the right and 1-39% on the left. No changes at this time.  Check annually.

## 2020-12-04 ENCOUNTER — Other Ambulatory Visit: Payer: Self-pay | Admitting: Internal Medicine

## 2020-12-04 DIAGNOSIS — Z1231 Encounter for screening mammogram for malignant neoplasm of breast: Secondary | ICD-10-CM

## 2021-01-28 DIAGNOSIS — I10 Essential (primary) hypertension: Secondary | ICD-10-CM | POA: Diagnosis not present

## 2021-01-28 DIAGNOSIS — E119 Type 2 diabetes mellitus without complications: Secondary | ICD-10-CM | POA: Diagnosis not present

## 2021-01-28 DIAGNOSIS — Z79899 Other long term (current) drug therapy: Secondary | ICD-10-CM | POA: Diagnosis not present

## 2021-02-04 DIAGNOSIS — I10 Essential (primary) hypertension: Secondary | ICD-10-CM | POA: Diagnosis not present

## 2021-02-04 DIAGNOSIS — Z0000 Encounter for general adult medical examination without abnormal findings: Secondary | ICD-10-CM | POA: Diagnosis not present

## 2021-02-04 DIAGNOSIS — E118 Type 2 diabetes mellitus with unspecified complications: Secondary | ICD-10-CM | POA: Diagnosis not present

## 2021-02-04 DIAGNOSIS — D649 Anemia, unspecified: Secondary | ICD-10-CM | POA: Diagnosis not present

## 2021-02-04 DIAGNOSIS — E7800 Pure hypercholesterolemia, unspecified: Secondary | ICD-10-CM | POA: Diagnosis not present

## 2021-02-04 DIAGNOSIS — I351 Nonrheumatic aortic (valve) insufficiency: Secondary | ICD-10-CM | POA: Diagnosis not present

## 2021-02-19 ENCOUNTER — Ambulatory Visit
Admission: RE | Admit: 2021-02-19 | Discharge: 2021-02-19 | Disposition: A | Discharge status: Home / Self Care | Payer: PPO | Source: Ambulatory Visit | Attending: Internal Medicine | Admitting: Internal Medicine

## 2021-02-19 ENCOUNTER — Other Ambulatory Visit: Payer: Self-pay

## 2021-02-19 DIAGNOSIS — Z1231 Encounter for screening mammogram for malignant neoplasm of breast: Secondary | ICD-10-CM | POA: Insufficient documentation

## 2021-02-21 DIAGNOSIS — H43813 Vitreous degeneration, bilateral: Secondary | ICD-10-CM | POA: Diagnosis not present

## 2021-03-18 DIAGNOSIS — I10 Essential (primary) hypertension: Secondary | ICD-10-CM | POA: Diagnosis not present

## 2021-03-18 DIAGNOSIS — E7800 Pure hypercholesterolemia, unspecified: Secondary | ICD-10-CM | POA: Diagnosis not present

## 2021-03-18 DIAGNOSIS — E118 Type 2 diabetes mellitus with unspecified complications: Secondary | ICD-10-CM | POA: Diagnosis not present

## 2021-03-18 DIAGNOSIS — Z79899 Other long term (current) drug therapy: Secondary | ICD-10-CM | POA: Diagnosis not present

## 2021-05-20 DIAGNOSIS — M25561 Pain in right knee: Secondary | ICD-10-CM | POA: Diagnosis not present

## 2021-05-20 DIAGNOSIS — M7062 Trochanteric bursitis, left hip: Secondary | ICD-10-CM | POA: Diagnosis not present

## 2021-05-20 DIAGNOSIS — M25552 Pain in left hip: Secondary | ICD-10-CM | POA: Diagnosis not present

## 2021-07-10 DIAGNOSIS — E7800 Pure hypercholesterolemia, unspecified: Secondary | ICD-10-CM | POA: Diagnosis not present

## 2021-07-10 DIAGNOSIS — E118 Type 2 diabetes mellitus with unspecified complications: Secondary | ICD-10-CM | POA: Diagnosis not present

## 2021-07-10 DIAGNOSIS — Z79899 Other long term (current) drug therapy: Secondary | ICD-10-CM | POA: Diagnosis not present

## 2021-07-10 DIAGNOSIS — I10 Essential (primary) hypertension: Secondary | ICD-10-CM | POA: Diagnosis not present

## 2021-07-17 DIAGNOSIS — Z79899 Other long term (current) drug therapy: Secondary | ICD-10-CM | POA: Diagnosis not present

## 2021-07-17 DIAGNOSIS — D649 Anemia, unspecified: Secondary | ICD-10-CM | POA: Diagnosis not present

## 2021-07-17 DIAGNOSIS — I10 Essential (primary) hypertension: Secondary | ICD-10-CM | POA: Diagnosis not present

## 2021-07-17 DIAGNOSIS — E118 Type 2 diabetes mellitus with unspecified complications: Secondary | ICD-10-CM | POA: Diagnosis not present

## 2021-07-17 DIAGNOSIS — I351 Nonrheumatic aortic (valve) insufficiency: Secondary | ICD-10-CM | POA: Diagnosis not present

## 2021-07-17 DIAGNOSIS — E7800 Pure hypercholesterolemia, unspecified: Secondary | ICD-10-CM | POA: Diagnosis not present

## 2021-07-22 DIAGNOSIS — M2042 Other hammer toe(s) (acquired), left foot: Secondary | ICD-10-CM | POA: Diagnosis not present

## 2021-07-22 DIAGNOSIS — I7091 Generalized atherosclerosis: Secondary | ICD-10-CM | POA: Diagnosis not present

## 2021-07-22 DIAGNOSIS — L603 Nail dystrophy: Secondary | ICD-10-CM | POA: Diagnosis not present

## 2021-07-22 DIAGNOSIS — B351 Tinea unguium: Secondary | ICD-10-CM | POA: Diagnosis not present

## 2021-07-22 DIAGNOSIS — M2041 Other hammer toe(s) (acquired), right foot: Secondary | ICD-10-CM | POA: Diagnosis not present

## 2021-08-18 DIAGNOSIS — D229 Melanocytic nevi, unspecified: Secondary | ICD-10-CM | POA: Diagnosis not present

## 2021-08-18 DIAGNOSIS — L570 Actinic keratosis: Secondary | ICD-10-CM | POA: Diagnosis not present

## 2021-08-18 DIAGNOSIS — L821 Other seborrheic keratosis: Secondary | ICD-10-CM | POA: Diagnosis not present

## 2021-08-18 DIAGNOSIS — L814 Other melanin hyperpigmentation: Secondary | ICD-10-CM | POA: Diagnosis not present

## 2021-09-17 ENCOUNTER — Encounter: Payer: PPO | Admitting: Dermatology

## 2021-09-22 DIAGNOSIS — L814 Other melanin hyperpigmentation: Secondary | ICD-10-CM | POA: Diagnosis not present

## 2021-09-22 DIAGNOSIS — L821 Other seborrheic keratosis: Secondary | ICD-10-CM | POA: Diagnosis not present

## 2021-09-22 DIAGNOSIS — D229 Melanocytic nevi, unspecified: Secondary | ICD-10-CM | POA: Diagnosis not present

## 2021-09-22 DIAGNOSIS — L820 Inflamed seborrheic keratosis: Secondary | ICD-10-CM | POA: Diagnosis not present

## 2021-09-22 DIAGNOSIS — L570 Actinic keratosis: Secondary | ICD-10-CM | POA: Diagnosis not present

## 2021-10-15 DIAGNOSIS — E118 Type 2 diabetes mellitus with unspecified complications: Secondary | ICD-10-CM | POA: Diagnosis not present

## 2021-10-15 DIAGNOSIS — I10 Essential (primary) hypertension: Secondary | ICD-10-CM | POA: Diagnosis not present

## 2021-10-15 DIAGNOSIS — Z79899 Other long term (current) drug therapy: Secondary | ICD-10-CM | POA: Diagnosis not present

## 2021-10-22 DIAGNOSIS — E118 Type 2 diabetes mellitus with unspecified complications: Secondary | ICD-10-CM | POA: Diagnosis not present

## 2021-10-22 DIAGNOSIS — R0609 Other forms of dyspnea: Secondary | ICD-10-CM | POA: Diagnosis not present

## 2021-10-22 DIAGNOSIS — E7800 Pure hypercholesterolemia, unspecified: Secondary | ICD-10-CM | POA: Diagnosis not present

## 2021-10-22 DIAGNOSIS — I10 Essential (primary) hypertension: Secondary | ICD-10-CM | POA: Diagnosis not present

## 2021-11-18 ENCOUNTER — Ambulatory Visit (INDEPENDENT_AMBULATORY_CARE_PROVIDER_SITE_OTHER): Payer: PPO

## 2021-11-18 ENCOUNTER — Ambulatory Visit (INDEPENDENT_AMBULATORY_CARE_PROVIDER_SITE_OTHER): Payer: PPO | Admitting: Vascular Surgery

## 2021-11-18 ENCOUNTER — Other Ambulatory Visit: Payer: Self-pay

## 2021-11-18 VITALS — BP 112/67 | HR 70 | Ht 60.0 in | Wt 134.0 lb

## 2021-11-18 DIAGNOSIS — M545 Low back pain, unspecified: Secondary | ICD-10-CM | POA: Diagnosis not present

## 2021-11-18 DIAGNOSIS — K219 Gastro-esophageal reflux disease without esophagitis: Secondary | ICD-10-CM

## 2021-11-18 DIAGNOSIS — I6523 Occlusion and stenosis of bilateral carotid arteries: Secondary | ICD-10-CM

## 2021-11-18 DIAGNOSIS — E119 Type 2 diabetes mellitus without complications: Secondary | ICD-10-CM | POA: Diagnosis not present

## 2021-11-18 DIAGNOSIS — I10 Essential (primary) hypertension: Secondary | ICD-10-CM

## 2021-11-18 NOTE — Assessment & Plan Note (Signed)
Her carotid duplex today reveals stable 40 to 59% right ICA stenosis and stable 1 to 39% left ICA stenosis.  No change in medical regimen.  Recheck in 1 year.

## 2021-11-18 NOTE — Progress Notes (Signed)
MRN : [id number]  Heather Miranda is a 87 y.o. (1929-01-10) female who presents with chief complaint of  Chief Complaint  Patient presents with   Follow-up    1 yr carotid  .  History of Present Illness: Patient returns in follow-up of her carotid disease.  She is doing well today without any new complaints.  No focal neurologic symptoms. Specifically, the patient denies amaurosis fugax, speech or swallowing difficulties, or arm or leg weakness or numbness.  Her carotid duplex today reveals stable 40 to 59% right ICA stenosis and stable 1 to 39% left ICA stenosis.  Current Outpatient Medications  Medication Sig Dispense Refill   amLODipine (NORVASC) 5 MG tablet Take 5 mg by mouth daily.     aspirin 81 MG chewable tablet Chew by mouth daily.     atenolol (TENORMIN) 25 MG tablet Take 25 mg by mouth 2 (two) times daily.      calcium carbonate (OSCAL) 1500 (600 Ca) MG TABS tablet Take 600 mg of elemental calcium by mouth 2 (two) times daily with a meal.      Cholecalciferol (D3-1000) 25 MCG (1000 UT) capsule Take 1,000 Units by mouth daily.     fexofenadine (ALLEGRA) 60 MG tablet Take 60 mg by mouth daily as needed for allergies.      guaiFENesin (MUCINEX) 600 MG 12 hr tablet Take 600 mg by mouth 2 (two) times daily as needed for cough.      losartan-hydrochlorothiazide (HYZAAR) 50-12.5 MG tablet Take 1 tablet by mouth daily.     Magnesium 500 MG CAPS Take by mouth.     Multiple Vitamins-Minerals (CENTRUM SILVER PO) Take by mouth.     omeprazole (PRILOSEC) 20 MG capsule Take 20 mg by mouth 2 (two) times daily before a meal.      triamcinolone cream (KENALOG) 0.1 %      No current facility-administered medications for this visit.    Past Medical History:  Diagnosis Date   Arthritis    Dysrhythmia    Environmental and seasonal allergies    GERD (gastroesophageal reflux disease)    Hernia, hiatal    Hx of dysplastic nevus 09/13/2015   L mid back paraspinal, moderate atypia    Past  Surgical History:  Procedure Laterality Date   abd cyst removed     BACK SURGERY  09/29/2017   CATARACT EXTRACTION W/ INTRAOCULAR LENS  IMPLANT, BILATERAL     DUPUYTREN CONTRACTURE RELEASE Left    KNEE SURGERY     right   LUMBAR LAMINECTOMY/DECOMPRESSION MICRODISCECTOMY N/A 09/29/2017   Procedure: LUMBAR LAMINECTOMY/DECOMPRESSION MICRODISCECTOMY 1 LEVEL L4-5;  Surgeon: Meade Maw, MD;  Location: [facility] OR[facility];  Service: Neurosurgery;  Laterality: N/A;   ROTATOR CUFF REPAIR Right    WRIST SURGERY     right     Social History   Tobacco Use   Smoking status: Never   Smokeless tobacco: Never  Vaping Use   Vaping Use: Never used  Substance Use Topics   Alcohol use: Yes    Alcohol/week: 4.0 standard drinks    Types: 4 Glasses of wine per week   Drug use: No      Family History  Problem Relation Age of Onset   Breast cancer Mother 84   Breast cancer Maternal Aunt      Allergies  Allergen Reactions   Morphine And Related Nausea Only   Oxycontin [Oxycodone] Nausea Only   Percocet [Oxycodone-Acetaminophen] Nausea Only   Morphine Nausea Only  Oxycodone-Acetaminophen Rash    REVIEW OF SYSTEMS (Negative unless checked)   Constitutional: [] Weight loss  [] Fever  [] Chills Cardiac: [] Chest pain   [] Chest pressure   [x] Palpitations   [] Shortness of breath when laying flat   [] Shortness of breath at rest   [] Shortness of breath with exertion. Vascular:  [x] Pain in legs with walking   [] Pain in legs at rest   [] Pain in legs when laying flat   [] Claudication   [] Pain in feet when walking  [] Pain in feet at rest  [] Pain in feet when laying flat   [] History of DVT   [] Phlebitis   [x] Swelling in legs   [] Varicose veins   [] Non-healing ulcers Pulmonary:   [] Uses home oxygen   [] Productive cough   [] Hemoptysis   [] Wheeze  [] COPD   [] Asthma Neurologic:  [] Dizziness  [] Blackouts   [] Seizures   [] History of stroke   [] History of TIA  [] Aphasia   [] Temporary blindness   [] Dysphagia    [] Weakness or numbness in arms   [] Weakness or numbness in legs Musculoskeletal:  [x] Arthritis   [] Joint swelling   [] Joint pain   [x] Low back pain Hematologic:  [] Easy bruising  [] Easy bleeding   [] Hypercoagulable state   [] Anemic  [] Hepatitis Gastrointestinal:  [] Blood in stool   [] Vomiting blood  [x] Gastroesophageal reflux/heartburn   [] Difficulty swallowing. Genitourinary:  [] Chronic kidney disease   [] Difficult urination  [] Frequent urination  [] Burning with urination   [] Blood in urine Skin:  [] Rashes   [] Ulcers   [] Wounds Psychological:  [] History of anxiety   []  History of major depression.  Physical Examination  Vitals:   11/18/21 1327  BP: 112/67  Pulse: 70  Weight: 134 lb (60.8 kg)  Height: 5' (1.524 m)   Body mass index is 26.17 kg/m. Gen:  WD/WN, NAD. Appears younger than stated age. Head: [place]/AT, No temporalis wasting. Ear/Nose/Throat: Hearing grossly intact, nares w/o erythema or drainage, trachea midline Eyes: Conjunctiva clear. Sclera non-icteric Neck: Supple.  no bruit  Pulmonary:  Good air movement, equal and clear to auscultation bilaterally.  Cardiac: RRR, No JVD Vascular:  Vessel Right Left  Radial Palpable Palpable       Musculoskeletal: M/S 5/5 throughout.  No deformity or atrophy. No edema. Neurologic: CN 2-12 intact. Sensation grossly intact in extremities.  Symmetrical.  Speech is fluent. Motor exam as listed above. Psychiatric: Judgment intact, Mood & affect appropriate for pt's clinical situation. Dermatologic: No rashes or ulcers noted.  No cellulitis or open wounds.     CBC Lab Results  Component Value Date   WBC 5.5 09/21/2017   HGB 13.3 09/21/2017   HCT 39.2 09/21/2017   MCV 91.8 09/21/2017   PLT 238 09/21/2017    BMET    Component Value Date/Time   NA 130 (L) 09/21/2017 0847   K 4.2 09/21/2017 0847   CL 97 (L) 09/21/2017 0847   CO2 25 09/21/2017 0847   GLUCOSE 78 09/21/2017 0847   BUN 16 09/21/2017 0847   CREATININE 0.68  09/21/2017 0847   CALCIUM 9.2 09/21/2017 0847   GFRNONAA >60 09/21/2017 0847   GFRAA >60 09/21/2017 0847   CrCl cannot be calculated (Patient's most recent lab result is older than the maximum 21 days allowed.).  COAG Lab Results  Component Value Date   INR 1.05 09/21/2017    Radiology No results found.   Assessment/Plan Low back pain Improved after back surgery   GERD (gastroesophageal reflux disease) Continue antihypertensive medications as already ordered, these medications have been  reviewed and there are no changes at this time. Avoidence of caffeine and alcohol Moderate elevation of the head of the bed    Benign essential hypertension blood pressure control important in reducing the progression of atherosclerotic disease. On appropriate oral medications.   Type 2 diabetes mellitus without complication (HCC) blood glucose control important in reducing the progression of atherosclerotic disease. Also, involved in wound healing. On appropriate medications.  Carotid stenosis Her carotid duplex today reveals stable 40 to 59% right ICA stenosis and stable 1 to 39% left ICA stenosis.  No change in medical regimen.  Recheck in 1 year.    Leotis Pain, MD  11/18/2021 2:32 PM    This note was created with Dragon medical transcription system.  Any errors from dictation are purely unintentional

## 2022-01-12 ENCOUNTER — Other Ambulatory Visit: Payer: Self-pay | Admitting: Internal Medicine

## 2022-01-12 DIAGNOSIS — Z1231 Encounter for screening mammogram for malignant neoplasm of breast: Secondary | ICD-10-CM

## 2022-01-26 DIAGNOSIS — I10 Essential (primary) hypertension: Secondary | ICD-10-CM | POA: Diagnosis not present

## 2022-01-26 DIAGNOSIS — Z1231 Encounter for screening mammogram for malignant neoplasm of breast: Secondary | ICD-10-CM | POA: Diagnosis not present

## 2022-01-26 DIAGNOSIS — E118 Type 2 diabetes mellitus with unspecified complications: Secondary | ICD-10-CM | POA: Diagnosis not present

## 2022-01-26 DIAGNOSIS — Z79899 Other long term (current) drug therapy: Secondary | ICD-10-CM | POA: Diagnosis not present

## 2022-01-26 DIAGNOSIS — E7800 Pure hypercholesterolemia, unspecified: Secondary | ICD-10-CM | POA: Diagnosis not present

## 2022-03-27 DIAGNOSIS — M1712 Unilateral primary osteoarthritis, left knee: Secondary | ICD-10-CM | POA: Diagnosis not present

## 2022-03-27 DIAGNOSIS — G8929 Other chronic pain: Secondary | ICD-10-CM | POA: Diagnosis not present

## 2022-03-27 DIAGNOSIS — M25562 Pain in left knee: Secondary | ICD-10-CM | POA: Diagnosis not present

## 2022-04-06 ENCOUNTER — Ambulatory Visit
Admission: RE | Admit: 2022-04-06 | Discharge: 2022-04-06 | Disposition: A | Discharge status: Home / Self Care | Payer: PPO | Source: Ambulatory Visit | Attending: Internal Medicine | Admitting: Internal Medicine

## 2022-04-06 DIAGNOSIS — Z1231 Encounter for screening mammogram for malignant neoplasm of breast: Secondary | ICD-10-CM | POA: Diagnosis not present

## 2022-07-08 DIAGNOSIS — J209 Acute bronchitis, unspecified: Secondary | ICD-10-CM | POA: Diagnosis not present

## 2022-07-21 DIAGNOSIS — M1712 Unilateral primary osteoarthritis, left knee: Secondary | ICD-10-CM | POA: Diagnosis not present

## 2022-07-23 DIAGNOSIS — D3132 Benign neoplasm of left choroid: Secondary | ICD-10-CM | POA: Diagnosis not present

## 2022-07-23 DIAGNOSIS — H43813 Vitreous degeneration, bilateral: Secondary | ICD-10-CM | POA: Diagnosis not present

## 2022-07-23 DIAGNOSIS — Z961 Presence of intraocular lens: Secondary | ICD-10-CM | POA: Diagnosis not present

## 2022-07-27 DIAGNOSIS — M1712 Unilateral primary osteoarthritis, left knee: Secondary | ICD-10-CM | POA: Diagnosis not present

## 2022-08-13 DIAGNOSIS — Z0000 Encounter for general adult medical examination without abnormal findings: Secondary | ICD-10-CM | POA: Diagnosis not present

## 2022-08-13 DIAGNOSIS — D649 Anemia, unspecified: Secondary | ICD-10-CM | POA: Diagnosis not present

## 2022-08-13 DIAGNOSIS — I10 Essential (primary) hypertension: Secondary | ICD-10-CM | POA: Diagnosis not present

## 2022-08-13 DIAGNOSIS — Z79899 Other long term (current) drug therapy: Secondary | ICD-10-CM | POA: Diagnosis not present

## 2022-08-13 DIAGNOSIS — E118 Type 2 diabetes mellitus with unspecified complications: Secondary | ICD-10-CM | POA: Diagnosis not present

## 2022-08-13 DIAGNOSIS — E7800 Pure hypercholesterolemia, unspecified: Secondary | ICD-10-CM | POA: Diagnosis not present

## 2022-09-11 DIAGNOSIS — L988 Other specified disorders of the skin and subcutaneous tissue: Secondary | ICD-10-CM | POA: Diagnosis not present

## 2022-09-11 DIAGNOSIS — L308 Other specified dermatitis: Secondary | ICD-10-CM | POA: Diagnosis not present

## 2022-09-11 DIAGNOSIS — L853 Xerosis cutis: Secondary | ICD-10-CM | POA: Diagnosis not present

## 2022-09-11 DIAGNOSIS — L821 Other seborrheic keratosis: Secondary | ICD-10-CM | POA: Diagnosis not present

## 2022-09-11 DIAGNOSIS — D1801 Hemangioma of skin and subcutaneous tissue: Secondary | ICD-10-CM | POA: Diagnosis not present

## 2022-09-11 DIAGNOSIS — L814 Other melanin hyperpigmentation: Secondary | ICD-10-CM | POA: Diagnosis not present

## 2022-09-22 DIAGNOSIS — M5489 Other dorsalgia: Secondary | ICD-10-CM | POA: Diagnosis not present

## 2022-09-22 DIAGNOSIS — M25511 Pain in right shoulder: Secondary | ICD-10-CM | POA: Diagnosis not present

## 2022-09-22 DIAGNOSIS — G8929 Other chronic pain: Secondary | ICD-10-CM | POA: Diagnosis not present

## 2022-11-10 ENCOUNTER — Ambulatory Visit (INDEPENDENT_AMBULATORY_CARE_PROVIDER_SITE_OTHER): Payer: PPO

## 2022-11-10 ENCOUNTER — Encounter (INDEPENDENT_AMBULATORY_CARE_PROVIDER_SITE_OTHER): Payer: Self-pay | Admitting: Vascular Surgery

## 2022-11-10 ENCOUNTER — Ambulatory Visit (INDEPENDENT_AMBULATORY_CARE_PROVIDER_SITE_OTHER): Payer: PPO | Admitting: Vascular Surgery

## 2022-11-10 VITALS — BP 138/56 | HR 77 | Resp 18 | Ht 60.0 in | Wt 136.0 lb

## 2022-11-10 DIAGNOSIS — E119 Type 2 diabetes mellitus without complications: Secondary | ICD-10-CM | POA: Diagnosis not present

## 2022-11-10 DIAGNOSIS — I6523 Occlusion and stenosis of bilateral carotid arteries: Secondary | ICD-10-CM | POA: Diagnosis not present

## 2022-11-10 DIAGNOSIS — I10 Essential (primary) hypertension: Secondary | ICD-10-CM | POA: Diagnosis not present

## 2022-11-10 DIAGNOSIS — M545 Low back pain, unspecified: Secondary | ICD-10-CM | POA: Diagnosis not present

## 2022-11-10 DIAGNOSIS — K219 Gastro-esophageal reflux disease without esophagitis: Secondary | ICD-10-CM

## 2022-11-10 NOTE — Assessment & Plan Note (Signed)
Carotid duplex demonstrates stable 40 to 59% right ICA stenosis and 1 to 39% left ICA stenosis.  No role for intervention at this level.  Continue current medical regimen.  Recheck in 1 year.

## 2022-11-10 NOTE — Progress Notes (Signed)
MRN : [id number]  Heather Miranda is a 88 y.o. ([birth year]) female who presents with chief complaint of No chief complaint on file. Marland Kitchen  History of Present Illness: Patient returns in follow-up of her carotid disease.  She is doing well today without any focal neurologic symptoms. Specifically, the patient denies amaurosis fugax, speech or swallowing difficulties, or arm or leg weakness or numbness.  Carotid duplex demonstrates stable 40 to 59% right ICA stenosis and 1 to 39% left ICA stenosis.  Current Outpatient Medications  Medication Sig Dispense Refill   amLODipine (NORVASC) 5 MG tablet Take 5 mg by mouth daily.     aspirin 81 MG chewable tablet Chew by mouth daily.     atenolol (TENORMIN) 25 MG tablet Take 25 mg by mouth 2 (two) times daily.      calcium carbonate (OSCAL) 1500 (600 Ca) MG TABS tablet Take 600 mg of elemental calcium by mouth 2 (two) times daily with a meal.      Cholecalciferol (D3-1000) 25 MCG (1000 UT) capsule Take 1,000 Units by mouth daily.     fexofenadine (ALLEGRA) 60 MG tablet Take 60 mg by mouth daily as needed for allergies.      losartan-hydrochlorothiazide (HYZAAR) 50-12.5 MG tablet Take 1 tablet by mouth daily.     Magnesium 500 MG CAPS Take by mouth.     Multiple Vitamins-Minerals (CENTRUM SILVER PO) Take by mouth.     omeprazole (PRILOSEC) 20 MG capsule Take 20 mg by mouth 2 (two) times daily before a meal.      No current facility-administered medications for this visit.    Past Medical History:  Diagnosis Date   Arthritis    Dysrhythmia    Environmental and seasonal allergies    GERD (gastroesophageal reflux disease)    Hernia, hiatal    Hx of dysplastic nevus 09/13/2015   L mid back paraspinal, moderate atypia    Past Surgical History:  Procedure Laterality Date   abd cyst removed     BACK SURGERY  09/29/2017   CATARACT EXTRACTION W/ INTRAOCULAR LENS  IMPLANT, BILATERAL     DUPUYTREN CONTRACTURE RELEASE Left    KNEE SURGERY     right    LUMBAR LAMINECTOMY/DECOMPRESSION MICRODISCECTOMY N/A 09/29/2017   Procedure: LUMBAR LAMINECTOMY/DECOMPRESSION MICRODISCECTOMY 1 LEVEL L4-5;  Surgeon: Meade Maw, MD;  Location: [facility];  Service: Neurosurgery;  Laterality: N/A;   ROTATOR CUFF REPAIR Right    WRIST SURGERY     right     Social History   Tobacco Use   Smoking status: Never   Smokeless tobacco: Never  Vaping Use   Vaping Use: Never used  Substance Use Topics   Alcohol use: Yes    Alcohol/week: 4.0 standard drinks of alcohol    Types: 4 Glasses of wine per week   Drug use: No      Family History  Problem Relation Age of Onset   Breast cancer Mother 28   Breast cancer Maternal Aunt      Allergies  Allergen Reactions   Morphine And Related Nausea Only   Oxycontin [Oxycodone] Nausea Only   Percocet [Oxycodone-Acetaminophen] Nausea Only   Morphine Nausea Only   Oxycodone-Acetaminophen Rash     REVIEW OF SYSTEMS (Negative unless checked)   Constitutional: '[]'$ Weight loss  '[]'$ Fever  '[]'$ Chills Cardiac: '[]'$ Chest pain   '[]'$ Chest pressure   '[x]'$ Palpitations   '[]'$ Shortness of breath when laying flat   '[]'$ Shortness of breath at rest   '[]'$ Shortness of breath  with exertion. Vascular:  '[x]'$ Pain in legs with walking   '[]'$ Pain in legs at rest   '[]'$ Pain in legs when laying flat   '[]'$ Claudication   '[]'$ Pain in feet when walking  '[]'$ Pain in feet at rest  '[]'$ Pain in feet when laying flat   '[]'$ History of DVT   '[]'$ Phlebitis   '[x]'$ Swelling in legs   '[]'$ Varicose veins   '[]'$ Non-healing ulcers Pulmonary:   '[]'$ Uses home oxygen   '[]'$ Productive cough   '[]'$ Hemoptysis   '[]'$ Wheeze  '[]'$ COPD   '[]'$ Asthma Neurologic:  '[]'$ Dizziness  '[]'$ Blackouts   '[]'$ Seizures   '[]'$ History of stroke   '[]'$ History of TIA  '[]'$ Aphasia   '[]'$ Temporary blindness   '[]'$ Dysphagia   '[]'$ Weakness or numbness in arms   '[]'$ Weakness or numbness in legs Musculoskeletal:  '[x]'$ Arthritis   '[]'$ Joint swelling   '[]'$ Joint pain   '[x]'$ Low back pain Hematologic:  '[]'$ Easy bruising  '[]'$ Easy bleeding   '[]'$ Hypercoagulable state    '[]'$ Anemic  '[]'$ Hepatitis Gastrointestinal:  '[]'$ Blood in stool   '[]'$ Vomiting blood  '[x]'$ Gastroesophageal reflux/heartburn   '[]'$ Difficulty swallowing. Genitourinary:  '[]'$ Chronic kidney disease   '[]'$ Difficult urination  '[]'$ Frequent urination  '[]'$ Burning with urination   '[]'$ Blood in urine Skin:  '[]'$ Rashes   '[]'$ Ulcers   '[]'$ Wounds Psychological:  '[]'$ History of anxiety   '[]'$  History of major depression.  Physical Examination  Vitals:   11/10/22 1339  BP: (!) 138/56  Pulse: 77  Resp: 18  Weight: 136 lb (61.7 kg)  Height: 5' (1.524 m)   Body mass index is 26.56 kg/m. Gen:  WD/WN, NAD. Appears younger than stated age. Head: /AT, No temporalis wasting. Ear/Nose/Throat: Hearing grossly intact, nares w/o erythema or drainage, trachea midline Eyes: Conjunctiva clear. Sclera non-icteric Neck: Supple.  Soft right bruit  Pulmonary:  Good air movement, equal and clear to auscultation bilaterally.  Cardiac: irregular Vascular:  Vessel Right Left  Radial Palpable Palpable           Musculoskeletal: M/S 5/5 throughout.  No deformity or atrophy. No edema. Walks with a walker Neurologic: CN 2-12 intact. Sensation grossly intact in extremities.  Symmetrical.  Speech is fluent. Motor exam as listed above. Psychiatric: Judgment intact, Mood & affect appropriate for pt's clinical situation. Dermatologic: No rashes or ulcers noted.  No cellulitis or open wounds.     CBC Lab Results  Component Value Date   WBC 5.5 09/21/2017   HGB 13.3 09/21/2017   HCT 39.2 09/21/2017   MCV 91.8 09/21/2017   PLT 238 09/21/2017    BMET    Component Value Date/Time   NA 130 (L) 09/21/2017 0847   K 4.2 09/21/2017 0847   CL 97 (L) 09/21/2017 0847   CO2 25 09/21/2017 0847   GLUCOSE 78 09/21/2017 0847   BUN 16 09/21/2017 0847   CREATININE 0.68 09/21/2017 0847   CALCIUM 9.2 09/21/2017 0847   GFRNONAA >60 09/21/2017 0847   GFRAA >60 09/21/2017 0847   CrCl cannot be calculated (Patient's most recent lab result is older  than the maximum 21 days allowed.).  COAG Lab Results  Component Value Date   INR 1.05 09/21/2017    Radiology No results found.   Assessment/Plan Low back pain Improved after back surgery   GERD (gastroesophageal reflux disease) Continue antihypertensive medications as already ordered, these medications have been reviewed and there are no changes at this time. Avoidence of caffeine and alcohol Moderate elevation of the head of the bed    Benign essential hypertension blood pressure control important in reducing the progression of atherosclerotic disease.  On appropriate oral medications.   Type 2 diabetes mellitus without complication (HCC) blood glucose control important in reducing the progression of atherosclerotic disease. Also, involved in wound healing. On appropriate medications.  Carotid stenosis Carotid duplex demonstrates stable 40 to 59% right ICA stenosis and 1 to 39% left ICA stenosis.  No role for intervention at this level.  Continue current medical regimen.  Recheck in 1 year.    Leotis Pain, MD  11/10/2022 2:03 PM    This note was created with Dragon medical transcription system.  Any errors from dictation are purely unintentional

## 2023-02-09 DIAGNOSIS — R8290 Unspecified abnormal findings in urine: Secondary | ICD-10-CM | POA: Diagnosis not present

## 2023-02-09 DIAGNOSIS — I10 Essential (primary) hypertension: Secondary | ICD-10-CM | POA: Diagnosis not present

## 2023-02-09 DIAGNOSIS — E118 Type 2 diabetes mellitus with unspecified complications: Secondary | ICD-10-CM | POA: Diagnosis not present

## 2023-02-09 DIAGNOSIS — E7800 Pure hypercholesterolemia, unspecified: Secondary | ICD-10-CM | POA: Diagnosis not present

## 2023-02-09 DIAGNOSIS — Z79899 Other long term (current) drug therapy: Secondary | ICD-10-CM | POA: Diagnosis not present

## 2023-02-16 DIAGNOSIS — E118 Type 2 diabetes mellitus with unspecified complications: Secondary | ICD-10-CM | POA: Diagnosis not present

## 2023-02-16 DIAGNOSIS — Z23 Encounter for immunization: Secondary | ICD-10-CM | POA: Diagnosis not present

## 2023-02-16 DIAGNOSIS — Z79899 Other long term (current) drug therapy: Secondary | ICD-10-CM | POA: Diagnosis not present

## 2023-02-16 DIAGNOSIS — I10 Essential (primary) hypertension: Secondary | ICD-10-CM | POA: Diagnosis not present

## 2023-02-16 DIAGNOSIS — E7800 Pure hypercholesterolemia, unspecified: Secondary | ICD-10-CM | POA: Diagnosis not present

## 2023-02-16 DIAGNOSIS — Z0000 Encounter for general adult medical examination without abnormal findings: Secondary | ICD-10-CM | POA: Diagnosis not present

## 2023-02-16 DIAGNOSIS — D649 Anemia, unspecified: Secondary | ICD-10-CM | POA: Diagnosis not present

## 2023-03-01 ENCOUNTER — Other Ambulatory Visit: Payer: Self-pay | Admitting: Internal Medicine

## 2023-03-01 DIAGNOSIS — Z1231 Encounter for screening mammogram for malignant neoplasm of breast: Secondary | ICD-10-CM

## 2023-04-08 ENCOUNTER — Ambulatory Visit
Admission: RE | Admit: 2023-04-08 | Discharge: 2023-04-08 | Disposition: A | Discharge status: Home / Self Care | Payer: PPO | Source: Ambulatory Visit | Attending: Internal Medicine | Admitting: Internal Medicine

## 2023-04-08 DIAGNOSIS — Z1231 Encounter for screening mammogram for malignant neoplasm of breast: Secondary | ICD-10-CM | POA: Diagnosis not present

## 2023-06-18 DIAGNOSIS — L814 Other melanin hyperpigmentation: Secondary | ICD-10-CM | POA: Diagnosis not present

## 2023-06-18 DIAGNOSIS — L820 Inflamed seborrheic keratosis: Secondary | ICD-10-CM | POA: Diagnosis not present

## 2023-06-18 DIAGNOSIS — L570 Actinic keratosis: Secondary | ICD-10-CM | POA: Diagnosis not present

## 2023-06-18 DIAGNOSIS — L821 Other seborrheic keratosis: Secondary | ICD-10-CM | POA: Diagnosis not present

## 2023-07-05 DIAGNOSIS — M7052 Other bursitis of knee, left knee: Secondary | ICD-10-CM | POA: Diagnosis not present

## 2023-07-05 DIAGNOSIS — M1712 Unilateral primary osteoarthritis, left knee: Secondary | ICD-10-CM | POA: Diagnosis not present

## 2023-07-14 DIAGNOSIS — M1712 Unilateral primary osteoarthritis, left knee: Secondary | ICD-10-CM | POA: Diagnosis not present

## 2023-07-26 DIAGNOSIS — H47323 Drusen of optic disc, bilateral: Secondary | ICD-10-CM | POA: Diagnosis not present

## 2023-07-26 DIAGNOSIS — D3132 Benign neoplasm of left choroid: Secondary | ICD-10-CM | POA: Diagnosis not present

## 2023-07-26 DIAGNOSIS — H43813 Vitreous degeneration, bilateral: Secondary | ICD-10-CM | POA: Diagnosis not present

## 2023-07-26 DIAGNOSIS — M3501 Sicca syndrome with keratoconjunctivitis: Secondary | ICD-10-CM | POA: Diagnosis not present

## 2023-08-10 DIAGNOSIS — E118 Type 2 diabetes mellitus with unspecified complications: Secondary | ICD-10-CM | POA: Diagnosis not present

## 2023-08-10 DIAGNOSIS — Z79899 Other long term (current) drug therapy: Secondary | ICD-10-CM | POA: Diagnosis not present

## 2023-08-10 DIAGNOSIS — E7800 Pure hypercholesterolemia, unspecified: Secondary | ICD-10-CM | POA: Diagnosis not present

## 2023-08-17 DIAGNOSIS — Z0000 Encounter for general adult medical examination without abnormal findings: Secondary | ICD-10-CM | POA: Diagnosis not present

## 2023-08-17 DIAGNOSIS — I10 Essential (primary) hypertension: Secondary | ICD-10-CM | POA: Diagnosis not present

## 2023-08-17 DIAGNOSIS — E7800 Pure hypercholesterolemia, unspecified: Secondary | ICD-10-CM | POA: Diagnosis not present

## 2023-08-17 DIAGNOSIS — Z79899 Other long term (current) drug therapy: Secondary | ICD-10-CM | POA: Diagnosis not present

## 2023-08-17 DIAGNOSIS — D649 Anemia, unspecified: Secondary | ICD-10-CM | POA: Diagnosis not present

## 2023-08-17 DIAGNOSIS — E118 Type 2 diabetes mellitus with unspecified complications: Secondary | ICD-10-CM | POA: Diagnosis not present

## 2023-10-28 DIAGNOSIS — L820 Inflamed seborrheic keratosis: Secondary | ICD-10-CM | POA: Diagnosis not present

## 2023-10-28 DIAGNOSIS — D1801 Hemangioma of skin and subcutaneous tissue: Secondary | ICD-10-CM | POA: Diagnosis not present

## 2023-10-28 DIAGNOSIS — L821 Other seborrheic keratosis: Secondary | ICD-10-CM | POA: Diagnosis not present

## 2023-10-28 DIAGNOSIS — L570 Actinic keratosis: Secondary | ICD-10-CM | POA: Diagnosis not present

## 2023-10-28 DIAGNOSIS — L814 Other melanin hyperpigmentation: Secondary | ICD-10-CM | POA: Diagnosis not present

## 2023-10-29 ENCOUNTER — Other Ambulatory Visit (INDEPENDENT_AMBULATORY_CARE_PROVIDER_SITE_OTHER): Payer: Self-pay | Admitting: Vascular Surgery

## 2023-10-29 DIAGNOSIS — I6523 Occlusion and stenosis of bilateral carotid arteries: Secondary | ICD-10-CM

## 2023-11-02 ENCOUNTER — Encounter (INDEPENDENT_AMBULATORY_CARE_PROVIDER_SITE_OTHER): Payer: Self-pay | Admitting: Vascular Surgery

## 2023-11-02 ENCOUNTER — Ambulatory Visit (INDEPENDENT_AMBULATORY_CARE_PROVIDER_SITE_OTHER): Payer: PPO | Admitting: Vascular Surgery

## 2023-11-02 ENCOUNTER — Ambulatory Visit (INDEPENDENT_AMBULATORY_CARE_PROVIDER_SITE_OTHER): Payer: PPO

## 2023-11-02 VITALS — BP 119/68 | HR 66 | Resp 18 | Ht 60.0 in | Wt 130.8 lb

## 2023-11-02 DIAGNOSIS — E119 Type 2 diabetes mellitus without complications: Secondary | ICD-10-CM | POA: Diagnosis not present

## 2023-11-02 DIAGNOSIS — E7800 Pure hypercholesterolemia, unspecified: Secondary | ICD-10-CM

## 2023-11-02 DIAGNOSIS — I6523 Occlusion and stenosis of bilateral carotid arteries: Secondary | ICD-10-CM

## 2023-11-02 DIAGNOSIS — I10 Essential (primary) hypertension: Secondary | ICD-10-CM

## 2023-11-02 NOTE — Progress Notes (Signed)
MRN : [id number]  Heather Miranda is a 89 y.o. (May 17, 1929) female who presents with chief complaint of  Chief Complaint  Patient presents with   Follow-up    f/u in 1 year with carotid -  .  History of Present Illness: Patient returns today in follow up of her carotid disease.  She is doing well today.  She denies any focal neurologic symptoms. Specifically, the patient denies amaurosis fugax, speech or swallowing difficulties, or arm or leg weakness or numbness.  Carotid duplex today shows 1 to 39% ICA stenosis bilaterally without progression and actually slight regression on the right.  Current Outpatient Medications  Medication Sig Dispense Refill   amLODipine (NORVASC) 5 MG tablet Take 5 mg by mouth daily.     aspirin 81 MG chewable tablet Chew by mouth daily.     atenolol (TENORMIN) 25 MG tablet Take 25 mg by mouth 2 (two) times daily.      calcium carbonate (OSCAL) 1500 (600 Ca) MG TABS tablet Take 600 mg of elemental calcium by mouth 2 (two) times daily with a meal.      Cholecalciferol (D3-1000) 25 MCG (1000 UT) capsule Take 1,000 Units by mouth daily.     fexofenadine (ALLEGRA) 60 MG tablet Take 60 mg by mouth daily as needed for allergies.      losartan-hydrochlorothiazide (HYZAAR) 50-12.5 MG tablet Take 1 tablet by mouth daily.     Magnesium 500 MG CAPS Take by mouth.     Multiple Vitamins-Minerals (CENTRUM SILVER PO) Take by mouth.     omeprazole (PRILOSEC) 20 MG capsule Take 20 mg by mouth 2 (two) times daily before a meal.      No current facility-administered medications for this visit.    Past Medical History:  Diagnosis Date   Arthritis    Dysrhythmia    Environmental and seasonal allergies    GERD (gastroesophageal reflux disease)    Hernia, hiatal    Hx of dysplastic nevus 09/13/2015   L mid back paraspinal, moderate atypia    Past Surgical History:  Procedure Laterality Date   abd cyst removed     BACK SURGERY  09/29/2017   CATARACT EXTRACTION W/  INTRAOCULAR LENS  IMPLANT, BILATERAL     DUPUYTREN CONTRACTURE RELEASE Left    KNEE SURGERY     right   LUMBAR LAMINECTOMY/DECOMPRESSION MICRODISCECTOMY N/A 09/29/2017   Procedure: LUMBAR LAMINECTOMY/DECOMPRESSION MICRODISCECTOMY 1 LEVEL L4-5;  Surgeon: Venetia Night, MD;  Location: [facility];  Service: Neurosurgery;  Laterality: N/A;   ROTATOR CUFF REPAIR Right    WRIST SURGERY     right     Social History   Tobacco Use   Smoking status: Never   Smokeless tobacco: Never  Vaping Use   Vaping status: Never Used  Substance Use Topics   Alcohol use: Yes    Alcohol/week: 4.0 standard drinks of alcohol    Types: 4 Glasses of wine per week   Drug use: No      Family History  Problem Relation Age of Onset   Breast cancer Mother 72   Breast cancer Maternal Aunt      Allergies  Allergen Reactions   Morphine And Codeine Nausea Only   Oxycontin [Oxycodone] Nausea Only   Percocet [Oxycodone-Acetaminophen] Nausea Only   Morphine Nausea Only   Oxycodone-Acetaminophen Rash     REVIEW OF SYSTEMS (Negative unless checked)   Constitutional: [] Weight loss  [] Fever  [] Chills Cardiac: [] Chest pain   [] Chest pressure   [  x]Palpitations   [] Shortness of breath when laying flat   [] Shortness of breath at rest   [] Shortness of breath with exertion. Vascular:  [x] Pain in legs with walking   [] Pain in legs at rest   [] Pain in legs when laying flat   [] Claudication   [] Pain in feet when walking  [] Pain in feet at rest  [] Pain in feet when laying flat   [] History of DVT   [] Phlebitis   [x] Swelling in legs   [] Varicose veins   [] Non-healing ulcers Pulmonary:   [] Uses home oxygen   [] Productive cough   [] Hemoptysis   [] Wheeze  [] COPD   [] Asthma Neurologic:  [] Dizziness  [] Blackouts   [] Seizures   [] History of stroke   [] History of TIA  [] Aphasia   [] Temporary blindness   [] Dysphagia   [] Weakness or numbness in arms   [] Weakness or numbness in legs Musculoskeletal:  [x] Arthritis   [] Joint  swelling   [] Joint pain   [x] Low back pain Hematologic:  [] Easy bruising  [] Easy bleeding   [] Hypercoagulable state   [] Anemic  [] Hepatitis Gastrointestinal:  [] Blood in stool   [] Vomiting blood  [x] Gastroesophageal reflux/heartburn   [] Difficulty swallowing. Genitourinary:  [] Chronic kidney disease   [] Difficult urination  [] Frequent urination  [] Burning with urination   [] Blood in urine Skin:  [] Rashes   [] Ulcers   [] Wounds Psychological:  [] History of anxiety   []  History of major depression.  Physical Examination  BP 119/68 (BP Location: Left Arm)   Pulse 66   Resp 18   Ht 5' (1.524 m)   Wt 130 lb 12.8 oz (59.3 kg)   BMI 25.55 kg/m  Gen:  WD/WN, NAD. Appears younger than stated age. Head: [place]/AT, No temporalis wasting. Ear/Nose/Throat: Hearing grossly intact, nares w/o erythema or drainage Eyes: Conjunctiva clear. Sclera non-icteric Neck: Supple.  Trachea midline Pulmonary:  Good air movement, no use of accessory muscles.  Cardiac: RRR, no JVD Vascular:  Vessel Right Left  Radial Palpable Palpable               Musculoskeletal: M/S 5/5 throughout.  Kyphotic.  Walks with a walker Neurologic: Sensation grossly intact in extremities.  Symmetrical.  Speech is fluent.  Psychiatric: Judgment intact, Mood & affect appropriate for pt's clinical situation. Dermatologic: No rashes or ulcers noted.  No cellulitis or open wounds.      Labs No results found for this or any previous visit (from the past 2160 hour(s)).  Radiology No results found.  Assessment/Plan Benign essential hypertension blood pressure control important in reducing the progression of atherosclerotic disease. On appropriate oral medications.   Type 2 diabetes mellitus without complication (HCC) blood glucose control important in reducing the progression of atherosclerotic disease. Also, involved in wound healing. On appropriate medications.  Carotid stenosis Carotid duplex today shows 1 to 39% ICA  stenosis bilaterally without progression and actually slight regression on the right.  Continue aspirin therapy.  The patient request to stretch out her follow-up to every other year and that is certainly reasonable with her very mild disease.    Festus Barren, MD  11/02/2023 2:37 PM    This note was created with Dragon medical transcription system.  Any errors from dictation are purely unintentional

## 2023-11-02 NOTE — Addendum Note (Signed)
Addended by: Annice Needy on: 11/02/2023 02:41 PM   Modules accepted: Orders

## 2023-11-02 NOTE — Assessment & Plan Note (Signed)
Carotid duplex today shows 1 to 39% ICA stenosis bilaterally without progression and actually slight regression on the right.  Continue aspirin therapy.  The patient request to stretch out her follow-up to every other year and that is certainly reasonable with her very mild disease.

## 2024-02-10 DIAGNOSIS — E118 Type 2 diabetes mellitus with unspecified complications: Secondary | ICD-10-CM | POA: Diagnosis not present

## 2024-02-10 DIAGNOSIS — E7800 Pure hypercholesterolemia, unspecified: Secondary | ICD-10-CM | POA: Diagnosis not present

## 2024-02-10 DIAGNOSIS — I10 Essential (primary) hypertension: Secondary | ICD-10-CM | POA: Diagnosis not present

## 2024-02-10 DIAGNOSIS — Z79899 Other long term (current) drug therapy: Secondary | ICD-10-CM | POA: Diagnosis not present

## 2024-02-18 DIAGNOSIS — L814 Other melanin hyperpigmentation: Secondary | ICD-10-CM | POA: Diagnosis not present

## 2024-02-18 DIAGNOSIS — L821 Other seborrheic keratosis: Secondary | ICD-10-CM | POA: Diagnosis not present

## 2024-02-18 DIAGNOSIS — L570 Actinic keratosis: Secondary | ICD-10-CM | POA: Diagnosis not present

## 2024-03-02 ENCOUNTER — Other Ambulatory Visit: Payer: Self-pay | Admitting: Internal Medicine

## 2024-03-02 DIAGNOSIS — Z1231 Encounter for screening mammogram for malignant neoplasm of breast: Secondary | ICD-10-CM

## 2024-04-11 ENCOUNTER — Ambulatory Visit
Admission: RE | Admit: 2024-04-11 | Discharge: 2024-04-11 | Disposition: A | Discharge status: Home / Self Care | Source: Ambulatory Visit | Attending: Internal Medicine | Admitting: Internal Medicine

## 2024-04-11 DIAGNOSIS — Z1231 Encounter for screening mammogram for malignant neoplasm of breast: Secondary | ICD-10-CM | POA: Insufficient documentation

## 2024-04-27 DIAGNOSIS — I10 Essential (primary) hypertension: Secondary | ICD-10-CM | POA: Diagnosis not present

## 2024-04-27 DIAGNOSIS — E7800 Pure hypercholesterolemia, unspecified: Secondary | ICD-10-CM | POA: Diagnosis not present

## 2024-04-27 DIAGNOSIS — Z79899 Other long term (current) drug therapy: Secondary | ICD-10-CM | POA: Diagnosis not present

## 2024-04-27 DIAGNOSIS — K219 Gastro-esophageal reflux disease without esophagitis: Secondary | ICD-10-CM | POA: Diagnosis not present

## 2024-04-27 DIAGNOSIS — Z0000 Encounter for general adult medical examination without abnormal findings: Secondary | ICD-10-CM | POA: Diagnosis not present

## 2024-04-27 DIAGNOSIS — E118 Type 2 diabetes mellitus with unspecified complications: Secondary | ICD-10-CM | POA: Diagnosis not present

## 2024-05-11 ENCOUNTER — Inpatient Hospital Stay
Admission: EM | Admit: 2024-05-11 | Discharge: 2024-05-17 | DRG: 645 | Disposition: A | Discharge status: Skilled Nursing Facility | Attending: Internal Medicine | Admitting: Internal Medicine

## 2024-05-11 ENCOUNTER — Emergency Department

## 2024-05-11 ENCOUNTER — Other Ambulatory Visit: Payer: Self-pay

## 2024-05-11 DIAGNOSIS — Z7982 Long term (current) use of aspirin: Secondary | ICD-10-CM

## 2024-05-11 DIAGNOSIS — I10 Essential (primary) hypertension: Secondary | ICD-10-CM | POA: Diagnosis not present

## 2024-05-11 DIAGNOSIS — Z961 Presence of intraocular lens: Secondary | ICD-10-CM | POA: Diagnosis present

## 2024-05-11 DIAGNOSIS — K219 Gastro-esophageal reflux disease without esophagitis: Secondary | ICD-10-CM | POA: Diagnosis not present

## 2024-05-11 DIAGNOSIS — R338 Other retention of urine: Secondary | ICD-10-CM | POA: Diagnosis not present

## 2024-05-11 DIAGNOSIS — E878 Other disorders of electrolyte and fluid balance, not elsewhere classified: Secondary | ICD-10-CM

## 2024-05-11 DIAGNOSIS — M6281 Muscle weakness (generalized): Secondary | ICD-10-CM | POA: Diagnosis not present

## 2024-05-11 DIAGNOSIS — K449 Diaphragmatic hernia without obstruction or gangrene: Secondary | ICD-10-CM | POA: Diagnosis not present

## 2024-05-11 DIAGNOSIS — J189 Pneumonia, unspecified organism: Secondary | ICD-10-CM | POA: Diagnosis not present

## 2024-05-11 DIAGNOSIS — R339 Retention of urine, unspecified: Secondary | ICD-10-CM | POA: Diagnosis present

## 2024-05-11 DIAGNOSIS — I509 Heart failure, unspecified: Secondary | ICD-10-CM | POA: Diagnosis present

## 2024-05-11 DIAGNOSIS — R059 Cough, unspecified: Secondary | ICD-10-CM | POA: Diagnosis not present

## 2024-05-11 DIAGNOSIS — E119 Type 2 diabetes mellitus without complications: Secondary | ICD-10-CM

## 2024-05-11 DIAGNOSIS — Z885 Allergy status to narcotic agent status: Secondary | ICD-10-CM

## 2024-05-11 DIAGNOSIS — K5900 Constipation, unspecified: Secondary | ICD-10-CM | POA: Diagnosis present

## 2024-05-11 DIAGNOSIS — E222 Syndrome of inappropriate secretion of antidiuretic hormone: Principal | ICD-10-CM | POA: Diagnosis present

## 2024-05-11 DIAGNOSIS — R531 Weakness: Secondary | ICD-10-CM | POA: Diagnosis not present

## 2024-05-11 DIAGNOSIS — R54 Age-related physical debility: Secondary | ICD-10-CM | POA: Diagnosis present

## 2024-05-11 DIAGNOSIS — Z79899 Other long term (current) drug therapy: Secondary | ICD-10-CM | POA: Diagnosis not present

## 2024-05-11 DIAGNOSIS — R2681 Unsteadiness on feet: Secondary | ICD-10-CM | POA: Diagnosis not present

## 2024-05-11 DIAGNOSIS — Z803 Family history of malignant neoplasm of breast: Secondary | ICD-10-CM | POA: Diagnosis not present

## 2024-05-11 DIAGNOSIS — E785 Hyperlipidemia, unspecified: Secondary | ICD-10-CM | POA: Diagnosis not present

## 2024-05-11 DIAGNOSIS — Z66 Do not resuscitate: Secondary | ICD-10-CM | POA: Diagnosis not present

## 2024-05-11 DIAGNOSIS — E861 Hypovolemia: Secondary | ICD-10-CM | POA: Diagnosis not present

## 2024-05-11 DIAGNOSIS — I110 Hypertensive heart disease with heart failure: Secondary | ICD-10-CM | POA: Diagnosis not present

## 2024-05-11 DIAGNOSIS — E871 Hypo-osmolality and hyponatremia: Secondary | ICD-10-CM | POA: Diagnosis not present

## 2024-05-11 DIAGNOSIS — E876 Hypokalemia: Secondary | ICD-10-CM | POA: Diagnosis present

## 2024-05-11 DIAGNOSIS — I2510 Atherosclerotic heart disease of native coronary artery without angina pectoris: Secondary | ICD-10-CM | POA: Diagnosis not present

## 2024-05-11 DIAGNOSIS — R0602 Shortness of breath: Secondary | ICD-10-CM | POA: Diagnosis not present

## 2024-05-11 DIAGNOSIS — R2689 Other abnormalities of gait and mobility: Secondary | ICD-10-CM | POA: Diagnosis not present

## 2024-05-11 DIAGNOSIS — R41841 Cognitive communication deficit: Secondary | ICD-10-CM | POA: Diagnosis not present

## 2024-05-11 DIAGNOSIS — I517 Cardiomegaly: Secondary | ICD-10-CM | POA: Diagnosis not present

## 2024-05-11 DIAGNOSIS — J309 Allergic rhinitis, unspecified: Secondary | ICD-10-CM | POA: Diagnosis not present

## 2024-05-11 LAB — BASIC METABOLIC PANEL WITH GFR
Anion gap: 12 (ref 5–15)
BUN: 15 mg/dL (ref 8–23)
CO2: 26 mmol/L (ref 22–32)
Calcium: 9.1 mg/dL (ref 8.9–10.3)
Chloride: 80 mmol/L — ABNORMAL LOW (ref 98–111)
Creatinine, Ser: 0.63 mg/dL (ref 0.44–1.00)
GFR, Estimated: >60 mL/min (ref >60)
Glucose, Bld: 117 mg/dL — ABNORMAL HIGH (ref 70–99)
Potassium: 3.5 mmol/L (ref 3.5–5.1)
Sodium: 118 mmol/L — CL (ref 135–145)

## 2024-05-11 LAB — CBC WITH DIFFERENTIAL/PLATELET
Abs Immature Granulocytes: 0.08 10*3/uL — ABNORMAL HIGH (ref 0.00–0.07)
Basophils Absolute: 0 10*3/uL (ref 0.0–0.1)
Basophils Relative: 0 %
Eosinophils Absolute: 0 10*3/uL (ref 0.0–0.5)
Eosinophils Relative: 0 %
HCT: 41.6 % (ref 36.0–46.0)
Hemoglobin: 15.1 g/dL — ABNORMAL HIGH (ref 12.0–15.0)
Immature Granulocytes: 1 %
Lymphocytes Relative: 18 %
Lymphs Abs: 2.1 10*3/uL (ref 0.7–4.0)
MCH: 31.1 pg (ref 26.0–34.0)
MCHC: 36.3 g/dL — ABNORMAL HIGH (ref 30.0–36.0)
MCV: 85.8 fL (ref 80.0–100.0)
Monocytes Absolute: 1.6 10*3/uL — ABNORMAL HIGH (ref 0.1–1.0)
Monocytes Relative: 13 %
Neutro Abs: 8.2 10*3/uL — ABNORMAL HIGH (ref 1.7–7.7)
Neutrophils Relative %: 68 %
Platelets: 350 10*3/uL (ref 150–400)
RBC: 4.85 MIL/uL (ref 3.87–5.11)
RDW: 12.8 % (ref 11.5–15.5)
WBC: 12 10*3/uL — ABNORMAL HIGH (ref 4.0–10.5)
nRBC: 0 % (ref 0.0–0.2)

## 2024-05-11 LAB — URINALYSIS, W/ REFLEX TO CULTURE (INFECTION SUSPECTED)
Bilirubin Urine: NEGATIVE
Glucose, UA: NEGATIVE mg/dL
Hgb urine dipstick: NEGATIVE
Ketones, ur: NEGATIVE mg/dL
Leukocytes,Ua: NEGATIVE
Nitrite: NEGATIVE
Protein, ur: NEGATIVE mg/dL
Specific Gravity, Urine: 1.005 (ref 1.005–1.030)
pH: 7 (ref 5.0–8.0)

## 2024-05-11 LAB — BRAIN NATRIURETIC PEPTIDE: B Natriuretic Peptide: 99 pg/mL (ref 0.0–100.0)

## 2024-05-11 LAB — MRSA NEXT GEN BY PCR, NASAL: MRSA by PCR Next Gen: NOT DETECTED

## 2024-05-11 MED ORDER — VITAMIN D 25 MCG (1000 UNIT) PO TABS
1000.0000 [IU] | ORAL_TABLET | Freq: Every day | ORAL | Status: DC
  Administered 2024-05-12 – 2024-05-17 (×6): 1000 [IU] via ORAL
  Filled 2024-05-11 (×6): qty 1

## 2024-05-11 MED ORDER — ENOXAPARIN SODIUM 40 MG/0.4ML IJ SOSY
40.0000 mg | PREFILLED_SYRINGE | Freq: Every day | INTRAMUSCULAR | Status: DC
  Administered 2024-05-11 – 2024-05-16 (×6): 40 mg via SUBCUTANEOUS
  Filled 2024-05-11 (×6): qty 0.4

## 2024-05-11 MED ORDER — ONDANSETRON HCL 4 MG PO TABS: 4.0000 mg | ORAL_TABLET | Freq: Four times a day (QID) | ORAL | Status: AC | PRN

## 2024-05-11 MED ORDER — ACETAMINOPHEN 650 MG RE SUPP: 650.0000 mg | Freq: Four times a day (QID) | RECTAL | Status: AC | PRN

## 2024-05-11 MED ORDER — ADULT MULTIVITAMIN W/MINERALS CH
1.0000 | ORAL_TABLET | Freq: Every day | ORAL | Status: DC
  Administered 2024-05-12 – 2024-05-17 (×6): 1 via ORAL
  Filled 2024-05-11 (×6): qty 1

## 2024-05-11 MED ORDER — LOSARTAN POTASSIUM 50 MG PO TABS
100.0000 mg | ORAL_TABLET | Freq: Every day | ORAL | Status: DC
  Administered 2024-05-11 – 2024-05-17 (×7): 100 mg via ORAL
  Filled 2024-05-11 (×7): qty 2

## 2024-05-11 MED ORDER — CALCIUM CARBONATE 1250 (500 CA) MG PO TABS
500.0000 mg | ORAL_TABLET | Freq: Two times a day (BID) | ORAL | Status: DC
  Administered 2024-05-11 – 2024-05-17 (×12): 1250 mg via ORAL
  Filled 2024-05-11 (×12): qty 1

## 2024-05-11 MED ORDER — MAGNESIUM OXIDE -MG SUPPLEMENT 400 (240 MG) MG PO TABS
400.0000 mg | ORAL_TABLET | Freq: Every day | ORAL | Status: DC
  Administered 2024-05-12 – 2024-05-17 (×6): 400 mg via ORAL
  Filled 2024-05-11 (×6): qty 1

## 2024-05-11 MED ORDER — SODIUM CHLORIDE 0.9 % IV SOLN
500.0000 mg | Freq: Once | INTRAVENOUS | Status: AC
  Administered 2024-05-11: 500 mg via INTRAVENOUS
  Filled 2024-05-11: qty 5

## 2024-05-11 MED ORDER — HYDRALAZINE HCL 20 MG/ML IJ SOLN: 5.0000 mg | Freq: Four times a day (QID) | INTRAMUSCULAR | Status: AC | PRN

## 2024-05-11 MED ORDER — PANTOPRAZOLE SODIUM 40 MG PO TBEC
40.0000 mg | DELAYED_RELEASE_TABLET | Freq: Two times a day (BID) | ORAL | Status: DC
  Administered 2024-05-11 – 2024-05-17 (×12): 40 mg via ORAL
  Filled 2024-05-11 (×12): qty 1

## 2024-05-11 MED ORDER — SODIUM CHLORIDE 0.9 % IV SOLN
2.0000 g | INTRAVENOUS | Status: DC
  Administered 2024-05-12: 2 g via INTRAVENOUS
  Filled 2024-05-11: qty 20

## 2024-05-11 MED ORDER — SODIUM CHLORIDE 0.9 % IV SOLN
500.0000 mg | INTRAVENOUS | Status: DC
  Administered 2024-05-12 – 2024-05-13 (×2): 500 mg via INTRAVENOUS
  Filled 2024-05-11 (×4): qty 5

## 2024-05-11 MED ORDER — SODIUM CHLORIDE 0.9 % IV BOLUS
1000.0000 mL | Freq: Once | INTRAVENOUS | Status: AC
  Administered 2024-05-11: 1000 mL via INTRAVENOUS

## 2024-05-11 MED ORDER — ASPIRIN 81 MG PO CHEW
81.0000 mg | CHEWABLE_TABLET | Freq: Every day | ORAL | Status: DC
  Administered 2024-05-11 – 2024-05-17 (×7): 81 mg via ORAL
  Filled 2024-05-11 (×7): qty 1

## 2024-05-11 MED ORDER — SODIUM CHLORIDE 0.9 % IV SOLN
2.0000 g | Freq: Once | INTRAVENOUS | Status: AC
  Administered 2024-05-11: 2 g via INTRAVENOUS
  Filled 2024-05-11: qty 20

## 2024-05-11 MED ORDER — ACETAMINOPHEN 325 MG PO TABS
650.0000 mg | ORAL_TABLET | Freq: Four times a day (QID) | ORAL | Status: AC | PRN
  Administered 2024-05-12 (×2): 650 mg via ORAL
  Filled 2024-05-11 (×2): qty 2

## 2024-05-11 MED ORDER — AMLODIPINE BESYLATE 5 MG PO TABS
5.0000 mg | ORAL_TABLET | Freq: Every day | ORAL | Status: DC
  Administered 2024-05-11 – 2024-05-16 (×6): 5 mg via ORAL
  Filled 2024-05-11 (×6): qty 1

## 2024-05-11 MED ORDER — ATENOLOL 25 MG PO TABS
25.0000 mg | ORAL_TABLET | Freq: Two times a day (BID) | ORAL | Status: DC
  Administered 2024-05-11 – 2024-05-17 (×10): 25 mg via ORAL
  Filled 2024-05-11 (×12): qty 1

## 2024-05-11 MED ORDER — ONDANSETRON HCL 4 MG/2ML IJ SOLN
4.0000 mg | Freq: Four times a day (QID) | INTRAMUSCULAR | Status: AC | PRN
  Administered 2024-05-12: 4 mg via INTRAVENOUS
  Filled 2024-05-11: qty 2

## 2024-05-11 MED ORDER — HEPARIN SODIUM (PORCINE) 5000 UNIT/ML IJ SOLN: 5000.0000 [IU] | Freq: Three times a day (TID) | INTRAMUSCULAR | Status: DC

## 2024-05-11 NOTE — H&P (Addendum)
 History and Physical   RUDY MCCLENNY WUJ:[id number] DOB: Dec 17, 1929 DOA: 05/11/2024  PCP: Yehuda Helms, MD Outpatient Specialists: Dr. Aubry Blase, orthopedic surgery Patient coming from: Home  I have personally briefly reviewed patient's old medical records in [facility] EMR.  Chief Concern: Cough, congestion  HPI: Ms. Heather Miranda is a 89 year old female with history of hypertension, GERD, who presents from [facility] [place] for chief concerns of cough, congestion for 2 weeks.  Vitals in the ED showed T of 90, rr 23, hr 72, blood pressure 145/74, SpO2 of 94% on room air.  Serum sodium is 118, potassium 3.5, chloride 80, bicarb 26, BUN of 15, serum creatinine 2.63, EGFR greater than 60, nonfasting blood glucose 117, WBC 12, hemoglobin 15.1, platelets of 350.  BNP was 99.  ED treatment: Azithromycin 500 mg IV one-time dose, ceftriaxone IV one-time dose, sodium chloride  1 L bolus. ------------------------------------ At bedside, patient is able to tell me her first and last name, age, location, current calendar year.  She was able to identify her son at bedside.  She reports that she has been feeling crummy for the last 2 weeks.  She reports that she has had increased cough and congestion and was initially prescribed an antibiotic which she did complete however she states that she thought she was allergic to it because it just made her very weak and tired.  She was then prescribed a second antibiotic which she is only taken for the last 3 to 4 days and has not completed the course yet.  She does not know what the second antibiotic name is.  She denies trauma to her person.  She denies chest pain, abdominal pain, dysuria, hematuria, diarrhea, blood in her stool.  She denies swelling of her lower extremities.  She reports a lack of appetite over the last 2 weeks as well.  She reports that her sodium level is usually low.  Social history: She lives at home in a retirement community.   She denies tobacco, EtOH, recreational drug use.  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  ROS: Constitutional: no weight change, no fever ENT/Mouth: no sore throat, no rhinorrhea Eyes: no eye pain, no vision changes Cardiovascular: no chest pain, + dyspnea,  no edema, no palpitations Respiratory: + cough, no sputum, no wheezing, + congestion Gastrointestinal: no nausea, no vomiting, no diarrhea, no constipation Genitourinary: no urinary incontinence, no dysuria, no hematuria Musculoskeletal: no arthralgias, no myalgias Skin: no skin lesions, no pruritus, Neuro: + weakness, no loss of consciousness, no syncope Psych: no anxiety, no depression, + decrease appetite Heme/Lymph: no bruising, no bleeding  ED Course: Discussed with EDP, patient requiring hospitalization for chief concerns of hyponatremia and commune acquired pneumonia.  Assessment/Plan  Principal Problem:   Hyponatremia Active Problems:   GERD (gastroesophageal reflux disease)   Benign essential hypertension   Hyperlipidemia, unspecified   Type 2 diabetes mellitus without complication (HCC)   CAP (community acquired pneumonia)   Assessment and Plan:  * Hyponatremia Status post sodium chloride  1 L bolus per EDP Recheck BMP in a.m. And in setting of community-acquired pneumonia, we will check Legionella urine antigen If Legionella is positive, antibiotic will be changed to levofloxacin  CAP (community acquired pneumonia) Ceftriaxone 2 g IV daily, azithromycin 500 mg daily to complete a 5-day course Incentive spirometry, flutter valve Check MRSA PCR, urine Legionella  Type 2 diabetes mellitus without complication (HCC) Patient is current not on insulin and or oral antiglycemic agents  Benign essential hypertension Home amlodipine 5 mg daily, atenolol  25 mg p.o. twice daily, losartan 100 mg daily resumed Home losartan-hydrochlorothiazide combination not resumed on admission due to  presentation of significant hyponatremia Hydralazine 5 mg IV every 6 hours as needed for SBP greater than 170, 5 days ordered  GERD (gastroesophageal reflux disease) Home PPI twice daily for meals resumed  Chart reviewed.   DVT prophylaxis: Enoxaparin subcutaneous  Code Status: DNR/DNI, MOST form at bedside Diet: Regular diet Family Communication: Updated son at bedside with patient's permission Disposition Plan: Pending clinical course Consults called: PT/OT tomorrow Admission status: Telemetry medical, inpatient  Past Medical History:  Diagnosis Date   Arthritis    Dysrhythmia    Environmental and seasonal allergies    GERD (gastroesophageal reflux disease)    Hernia, hiatal    Hx of dysplastic nevus 09/13/2015   L mid back paraspinal, moderate atypia   Past Surgical History:  Procedure Laterality Date   abd cyst removed     BACK SURGERY  09/29/2017   CATARACT EXTRACTION W/ INTRAOCULAR LENS  IMPLANT, BILATERAL     DUPUYTREN CONTRACTURE RELEASE Left    KNEE SURGERY     right   LUMBAR LAMINECTOMY/DECOMPRESSION MICRODISCECTOMY N/A 09/29/2017   Procedure: LUMBAR LAMINECTOMY/DECOMPRESSION MICRODISCECTOMY 1 LEVEL L4-5;  Surgeon: Jodeen Munch, MD;  Location: [facility] ORS;  Service: Neurosurgery;  Laterality: N/A;   ROTATOR CUFF REPAIR Right    WRIST SURGERY     right   Social History:  reports that she has never smoked. She has never used smokeless tobacco. She reports current alcohol use of about 4.0 standard drinks of alcohol per week. She reports that she does not use drugs.  Allergies  Allergen Reactions   Morphine And Codeine Nausea Only   Oxycontin [Oxycodone] Nausea Only   Percocet [Oxycodone-Acetaminophen ] Nausea Only   Morphine Nausea Only   Oxycodone-Acetaminophen  Rash   Family History  Problem Relation Age of Onset   Breast cancer Mother 69   Breast cancer Maternal Aunt    Family history: Family history reviewed and not pertinent.  Prior to Admission  medications   Medication Sig Start Date End Date Taking? Authorizing Provider  amLODipine (NORVASC) 5 MG tablet Take 5 mg by mouth daily. 10/22/21   [provider]  aspirin 81 MG chewable tablet Chew by mouth daily.    [provider]  atenolol  (TENORMIN ) 25 MG tablet Take 25 mg by mouth 2 (two) times daily.  02/15/17   [provider]  calcium carbonate (OSCAL) 1500 (600 Ca) MG TABS tablet Take 600 mg of elemental calcium by mouth 2 (two) times daily with a meal.     [provider]  Cholecalciferol (D3-1000) 25 MCG (1000 UT) capsule Take 1,000 Units by mouth daily.    [provider]  fexofenadine (ALLEGRA) 60 MG tablet Take 60 mg by mouth daily as needed for allergies.     [provider]  losartan-hydrochlorothiazide (HYZAAR) 50-12.5 MG tablet Take 1 tablet by mouth daily. 06/23/21   [provider]  Magnesium 500 MG CAPS Take by mouth.    [provider]  Multiple Vitamins-Minerals (CENTRUM SILVER PO) Take by mouth.    [provider]  omeprazole (PRILOSEC) 20 MG capsule Take 20 mg by mouth 2 (two) times daily before a meal.  03/31/17   [provider]    Physical Exam: Vitals:   05/11/24 1130 05/11/24 1230 05/11/24 1400 05/11/24 1430  BP: (!) 157/69 (!) 171/85 (!) 173/86 [name]  169/81  Pulse: 70 76 100 94  Resp: (!) 21 19 (!) 22 (!) 21  Temp:      TempSrc:      SpO2: 97% 97% 92% 95%  Weight:      Height:       Constitutional: appears age-appropriate, frail Eyes: PERRL, lids and conjunctivae normal ENMT: Mucous membranes are moist. Posterior pharynx clear of any exudate or lesions. Age-appropriate dentition. Hearing appropriate Neck: normal, supple, no masses, no thyromegaly Respiratory: clear to auscultation bilaterally, no wheezing, no crackles. Normal respiratory effort. No accessory muscle use.  Cardiovascular: Regular rate and rhythm, no murmurs / rubs / gallops. No extremity edema. 2+ pedal  pulses. No carotid bruits.  Abdomen: no tenderness, no masses palpated, no hepatosplenomegaly. Bowel sounds positive.  Musculoskeletal: no clubbing / cyanosis. No joint deformity upper and lower extremities. Good ROM, no contractures, no atrophy. Normal muscle tone.  Skin: no rashes, lesions, ulcers. No induration Neurologic: Sensation intact. Strength 5/5 in all 4.  Psychiatric: Normal judgment and insight. Alert and oriented x 3. Normal mood.   EKG: independently reviewed, showing sinus rhythm with rate 75, QTc 421  Chest x-ray on Admission: I personally reviewed and I agree with radiologist reading as below.  DG Chest Portable 1 View Result Date: 05/11/2024 CLINICAL DATA:  Shortness of breath, cough. EXAM: PORTABLE CHEST 1 VIEW COMPARISON:  September 21, 2017. FINDINGS: Stable cardiomegaly. Large hiatal hernia is noted. Lungs are clear. Bony thorax is unremarkable. IMPRESSION: Large hiatal hernia.  No acute pulmonary disease. Electronically Signed   By: Rosalene Colon M.D.   On: 05/11/2024 12:30   Labs on Admission: I have personally reviewed following labs  CBC: Recent Labs  Lab 05/11/24 1058  WBC 12.0*  NEUTROABS 8.2*  HGB 15.1*  HCT 41.6  MCV 85.8  PLT 350   Basic Metabolic Panel: Recent Labs  Lab 05/11/24 1058  NA 118*  K 3.5  CL 80*  CO2 26  GLUCOSE 117*  BUN 15  CREATININE 0.63  CALCIUM 9.1   GFR: Estimated Creatinine Clearance: 34.7 mL/min (by C-G formula based on SCr of 0.63 mg/dL).  Urine analysis:    Component Value Date/Time   COLORURINE STRAW (A) 05/11/2024 1349   APPEARANCEUR CLEAR (A) 05/11/2024 1349   LABSPEC 1.005 05/11/2024 1349   PHURINE 7.0 05/11/2024 1349   GLUCOSEU NEGATIVE 05/11/2024 1349   HGBUR NEGATIVE 05/11/2024 1349   BILIRUBINUR NEGATIVE 05/11/2024 1349   KETONESUR NEGATIVE 05/11/2024 1349   PROTEINUR NEGATIVE 05/11/2024 1349   NITRITE NEGATIVE 05/11/2024 1349   LEUKOCYTESUR NEGATIVE 05/11/2024 1349   This document was prepared  using Dragon Voice Recognition software and may include unintentional dictation errors.  Dr. Reinhold Carbine [facility]  If 7PM-7AM, please contact overnight-coverage provider If 7AM-7PM, please contact day attending provider [email or web address]  05/11/2024, 2:48 PM

## 2024-05-11 NOTE — Assessment & Plan Note (Addendum)
 Home amlodipine 5 mg daily, atenolol  25 mg p.o. twice daily, losartan 100 mg daily resumed Home losartan-hydrochlorothiazide combination not resumed on admission due to presentation of significant hyponatremia Hydralazine 5 mg IV every 6 hours as needed for SBP greater than 170, 5 days ordered

## 2024-05-11 NOTE — Assessment & Plan Note (Addendum)
 Status post sodium chloride  1 L bolus per EDP Recheck BMP in a.m. And in setting of community-acquired pneumonia, we will check Legionella urine antigen If Legionella is positive, antibiotic will be changed to levofloxacin

## 2024-05-11 NOTE — Assessment & Plan Note (Signed)
 Patient is current not on insulin and or oral antiglycemic agents CBG within goal -Continue to monitor

## 2024-05-11 NOTE — ED Triage Notes (Signed)
 Pt presents to the ED via [facility] from [place] she resides in her apartment. Pt was seen two weeks ago for a cough. Pt was hypertensive with EMS. PT has hx of same and has not taken her medication today. Pt is currently taking prednisone and was prescribed cefdinir at the same time, but has not been taking it because she thinks she is allergic to it. Pt has been weak and fatigued. Difficulty ambulating around apartment by self. Pt has a congested and productive cough. Pt reported clear mucus.

## 2024-05-11 NOTE — ED Notes (Signed)
 Pt requested bed pan at this time. Unable to give sample. Call bell within reach. Pt given water with MD's approval

## 2024-05-11 NOTE — Hospital Course (Addendum)
 Ms. Heather Miranda is a 89 year old female with history of hypertension, GERD, who presents from [facility] [place] for chief concerns of cough, congestion for 2 weeks.  Vitals in the ED showed T of 90, rr 23, hr 72, blood pressure 145/74, SpO2 of 94% on room air.  Serum sodium is 118, potassium 3.5, chloride 80, bicarb 26, BUN of 15, serum creatinine 2.63, EGFR greater than 60, nonfasting blood glucose 117, WBC 12, hemoglobin 15.1, platelets of 350. BNP was 99.  ED treatment: Azithromycin  500 mg IV one-time dose, ceftriaxone  IV one-time dose, sodium chloride  1 L bolus.  5/16: Vitals stable, sodium remained at 118 after getting 1 L of normal saline. Worsening weakness, hyponatremia labs ordered and transferring to stepdown to start on hypertonic saline.  Nephrology was consulted. Patient was on HCTZ at home which should not be continued on discharge.  5/17: Hemodynamically stable, sodium improved to 128.  Hypertonic saline was discontinued and she was started on normal saline at 40 mL/h. Expanded respiratory panel negative.  Pending PT and OT evaluation  5/18: Vital stable, sodium again started declining slowly, currently at 124.  Adding salt tablets.  5/19: Hemodynamically stable but sodium slowly decreasing despite taking salt tablets.  Nephrology is giving 1 dose of tolvaptan  15 mg.  Mild hypophosphatemia and hypokalemia which are being repleted. Foley catheter to be removed today to give her a voiding trial. PT and OT are recommending SNF.  5/20: Sodium improved to 128 s/p 1 dose of tolvaptan .  5/21: Sodium at 129 today otherwise stable labs.  Patient was instructed to limit fluid to 1500 mL, should not drink more free water and juice water with electrolytes.  She will also continue salt tablets and follow-up closely with nephrology as outpatient for further management of chronic hyponatremia.  We increased her home amlodipine  dose to 10 mg daily and discontinue HCTZ which should not be  restarted.  Her PCP can add another agent if needed except thiazide diuretic.  She will continue the rest of her home medications and follow-up closely with her providers for further assistance.

## 2024-05-11 NOTE — ED Notes (Signed)
 Assisted patient in ambulation to restroom at this time.

## 2024-05-11 NOTE — Assessment & Plan Note (Signed)
 Home PPI twice daily for meals resumed

## 2024-05-11 NOTE — ED Notes (Signed)
 This tech assisted pt to the bathroom. Pt urinated and had a BM in toilet.

## 2024-05-11 NOTE — Assessment & Plan Note (Addendum)
 Patient with history of upper respiratory symptoms for the past couple of weeks, received 2 courses of antibiotics. Chest x-ray without any abnormality and procalcitonin negative Strep pneumo negative, MRSA PCR negative -Discontinue antibiotics -Supportive care

## 2024-05-11 NOTE — ED Notes (Signed)
 Called CCMD for cardiac monitoring

## 2024-05-11 NOTE — ED Provider Notes (Signed)
 [facility] Provider Note    Event Date/Time   First MD Initiated Contact with Patient 05/11/24 1059     (approximate)   History   Chief Complaint: Shortness of Breath   HPI  Heather Miranda is a 89 y.o. female with a history of GERD who comes ED complaining of generalized weakness worsening for the past week associated shortness of breath and cough.  She reports being sick over the past 2 weeks, previously was seen and prescribed Omnicef, but did not take it because she was worried about drug allergies.  She has had decreased oral intake and now finding it difficult to move around the apartment or take care of herself alone.  No trauma/fall        Past Medical History:  Diagnosis Date   Arthritis    Dysrhythmia    Environmental and seasonal allergies    GERD (gastroesophageal reflux disease)    Hernia, hiatal    Hx of dysplastic nevus 09/13/2015   L mid back paraspinal, moderate atypia    Current Outpatient Rx   Order #: [id number] Class: Historical Med   Order #: [id number] Class: Historical Med   Order #: [id number] Class: Historical Med   Order #: [id number] Class: Historical Med   Order #: [id number] Class: Historical Med   Order #: [id number] Class: Historical Med   Order #: [id number] Class: Historical Med   Order #: [id number] Class: Historical Med   Order #: [id number] Class: Historical Med   Order #: [id number] Class: Historical Med    Past Surgical History:  Procedure Laterality Date   abd cyst removed     BACK SURGERY  09/29/2017   CATARACT EXTRACTION W/ INTRAOCULAR LENS  IMPLANT, BILATERAL     DUPUYTREN CONTRACTURE RELEASE Left    KNEE SURGERY     right   LUMBAR LAMINECTOMY/DECOMPRESSION MICRODISCECTOMY N/A 09/29/2017   Procedure: LUMBAR LAMINECTOMY/DECOMPRESSION MICRODISCECTOMY 1 LEVEL L4-5;  Surgeon: Jodeen Munch, MD;  Location: [facility] ORS;  Service: Neurosurgery;  Laterality: N/A;   ROTATOR CUFF REPAIR Right    WRIST SURGERY     right     Physical Exam   Triage Vital Signs: ED Triage Vitals  Encounter Vitals Group     BP 05/11/24 1054 (!) 145/74     Systolic BP Percentile --      Diastolic BP Percentile --      Pulse Rate 05/11/24 1054 72     Resp 05/11/24 1054 (!) 23     Temp 05/11/24 1054 98 F (36.7 C)     Temp Source 05/11/24 1054 Oral     SpO2 05/11/24 1054 94 %     Weight 05/11/24 1052 125 lb (56.7 kg)     Height 05/11/24 1052 5' (1.524 m)     Head Circumference --      Peak Flow --      Pain Score 05/11/24 1052 0     Pain Loc --      Pain Education --      Exclude from Growth Chart --     Most recent vital signs: Vitals:   05/11/24 1130 05/11/24 1230  BP: (!) 157/69 (!) 171/85  Pulse: 70 76  Resp: (!) 21 19  Temp:    SpO2: 97% 97%    General: Awake, no distress.  CV:  Good peripheral perfusion.  Regular rate rhythm Resp:  Normal effort.  Left lower lung crackles and decreased air movement.  No wheezing Abd:  No distention.  Soft nontender Other:  Dry oral  mucosa   ED Results / Procedures / Treatments   Labs (all labs ordered are listed, but only abnormal results are displayed) Labs Reviewed  BASIC METABOLIC PANEL WITH GFR - Abnormal; Notable for the following components:      Result Value   Sodium 118 (*)    Chloride 80 (*)    Glucose, Bld 117 (*)    All other components within normal limits  CBC WITH DIFFERENTIAL/PLATELET - Abnormal; Notable for the following components:   WBC 12.0 (*)    Hemoglobin 15.1 (*)    MCHC 36.3 (*)    Neutro Abs 8.2 (*)    Monocytes Absolute 1.6 (*)    Abs Immature Granulocytes 0.08 (*)    All other components within normal limits  MRSA NEXT GEN BY PCR, NASAL  BRAIN NATRIURETIC PEPTIDE  URINALYSIS, W/ REFLEX TO CULTURE (INFECTION SUSPECTED)  LEGIONELLA PNEUMOPHILA SEROGP 1 UR AG     EKG Interpreted by me Sinus rhythm rate of 75.  Normal axis and intervals.  Poor R wave progression.  Normal ST segments and T waves, no ischemic  changes.   RADIOLOGY Chest x-ray interpreted by me, no obvious consolidation.  Large hiatal hernia.  No pneumothorax.  Radiology report reviewed   PROCEDURES:  .Critical Care  Performed by: Jacquie Maudlin, MD Authorized by: Jacquie Maudlin, MD   Critical care provider statement:    Critical care time (minutes):  35   Critical care time was exclusive of:  Separately billable procedures and treating other patients   Critical care was necessary to treat or prevent imminent or life-threatening deterioration of the following conditions:  Metabolic crisis and dehydration   Critical care was time spent personally by me on the following activities:  Development of treatment plan with patient or surrogate, discussions with consultants, evaluation of patient's response to treatment, examination of patient, obtaining history from patient or surrogate, ordering and performing treatments and interventions, ordering and review of laboratory studies, ordering and review of radiographic studies, pulse oximetry, re-evaluation of patient's condition and review of old charts   Care discussed with: admitting provider      MEDICATIONS ORDERED IN ED: Medications  acetaminophen  (TYLENOL ) tablet 650 mg (has no administration in time range)    Or  acetaminophen  (TYLENOL ) suppository 650 mg (has no administration in time range)  ondansetron  (ZOFRAN ) tablet 4 mg (has no administration in time range)    Or  ondansetron  (ZOFRAN ) injection 4 mg (has no administration in time range)  heparin injection 5,000 Units (has no administration in time range)  hydrALAZINE (APRESOLINE) injection 5 mg (has no administration in time range)  cefTRIAXone (ROCEPHIN) 2 g in sodium chloride  0.9 % 100 mL IVPB (has no administration in time range)  azithromycin (ZITHROMAX) 500 mg in sodium chloride  0.9 % 250 mL IVPB (has no administration in time range)  sodium chloride  0.9 % bolus 1,000 mL (0 mLs Intravenous Stopped 05/11/24  1354)  cefTRIAXone (ROCEPHIN) 2 g in sodium chloride  0.9 % 100 mL IVPB (0 g Intravenous Stopped 05/11/24 1235)  azithromycin (ZITHROMAX) 500 mg in sodium chloride  0.9 % 250 mL IVPB (0 mg Intravenous Stopped 05/11/24 1354)     IMPRESSION / MDM / ASSESSMENT AND PLAN / ED COURSE  I reviewed the triage vital signs and the nursing notes.  DDx: Pneumonia, sepsis, pleural effusion, AKI, electrolyte derangement  Patient's presentation is most consistent with acute presentation with potential threat to life or bodily function.  Patient presents with generalized weakness, worsening over  the past 7 days in the setting of shortness of breath and cough for the past 2 weeks.  Appears dehydrated.  Will give IV fluids while obtaining labs and chest x-ray.   Clinical Course as of 05/11/24 1356  Thu May 11, 2024  1252 Hyponatremia discussed with nephrology who recommends IVF hydration for now, defer on hypertonic saline unless sodium fails to improve with hydration. [PS]  1313 Case discussed with hospitalist [PS]    Clinical Course User Index [PS] Jacquie Maudlin, MD     FINAL CLINICAL IMPRESSION(S) / ED DIAGNOSES   Final diagnoses:  Community acquired pneumonia of left lower lobe of lung  Hyponatremia     Rx / DC Orders   ED Discharge Orders     None        Note:  This document was prepared using Dragon voice recognition software and may include unintentional dictation errors.   Jacquie Maudlin, MD 05/11/24 1356

## 2024-05-12 DIAGNOSIS — I10 Essential (primary) hypertension: Secondary | ICD-10-CM | POA: Diagnosis not present

## 2024-05-12 DIAGNOSIS — E119 Type 2 diabetes mellitus without complications: Secondary | ICD-10-CM | POA: Diagnosis not present

## 2024-05-12 DIAGNOSIS — K219 Gastro-esophageal reflux disease without esophagitis: Secondary | ICD-10-CM

## 2024-05-12 DIAGNOSIS — E871 Hypo-osmolality and hyponatremia: Secondary | ICD-10-CM | POA: Diagnosis not present

## 2024-05-12 LAB — SODIUM
Sodium: 118 mmol/L — CL (ref 135–145)
Sodium: 117 mmol/L — CL (ref 135–145)
Sodium: 119 mmol/L — CL (ref 135–145)
Sodium: 120 mmol/L — ABNORMAL LOW (ref 135–145)
Sodium: 124 mmol/L — ABNORMAL LOW (ref 135–145)

## 2024-05-12 LAB — BASIC METABOLIC PANEL WITH GFR
Anion gap: 8 (ref 5–15)
BUN: 9 mg/dL (ref 8–23)
CO2: 25 mmol/L (ref 22–32)
Calcium: 8.2 mg/dL — ABNORMAL LOW (ref 8.9–10.3)
Chloride: 85 mmol/L — ABNORMAL LOW (ref 98–111)
Creatinine, Ser: 0.46 mg/dL (ref 0.44–1.00)
GFR, Estimated: >60 mL/min (ref >60)
Glucose, Bld: 93 mg/dL (ref 70–99)
Potassium: 4.1 mmol/L (ref 3.5–5.1)
Sodium: 118 mmol/L — CL (ref 135–145)

## 2024-05-12 LAB — CBC
HCT: 38.3 % (ref 36.0–46.0)
Hemoglobin: 13.9 g/dL (ref 12.0–15.0)
MCH: 31.2 pg (ref 26.0–34.0)
MCHC: 36.3 g/dL — ABNORMAL HIGH (ref 30.0–36.0)
MCV: 85.9 fL (ref 80.0–100.0)
Platelets: 300 10*3/uL (ref 150–400)
RBC: 4.46 MIL/uL (ref 3.87–5.11)
RDW: 12.9 % (ref 11.5–15.5)
WBC: 9.4 10*3/uL (ref 4.0–10.5)
nRBC: 0 % (ref 0.0–0.2)

## 2024-05-12 LAB — SODIUM, URINE, RANDOM: Sodium, Ur: 106 mmol/L

## 2024-05-12 LAB — GLUCOSE, CAPILLARY
Glucose-Capillary: 158 mg/dL — ABNORMAL HIGH (ref 70–99)
Glucose-Capillary: 138 mg/dL — ABNORMAL HIGH (ref 70–99)
Glucose-Capillary: 127 mg/dL — ABNORMAL HIGH (ref 70–99)

## 2024-05-12 LAB — OSMOLALITY, URINE: Osmolality, Ur: 346 mosm/kg (ref 300–900)

## 2024-05-12 LAB — OSMOLALITY: Osmolality: 246 mosm/kg — CL (ref 275–295)

## 2024-05-12 LAB — MRSA NEXT GEN BY PCR, NASAL: MRSA by PCR Next Gen: NOT DETECTED

## 2024-05-12 LAB — TSH: TSH: 2.905 u[IU]/mL (ref 0.350–4.500)

## 2024-05-12 LAB — PROCALCITONIN: Procalcitonin: <0.1 ng/mL

## 2024-05-12 MED ORDER — CHLORHEXIDINE GLUCONATE CLOTH 2 % EX PADS
6.0000 | MEDICATED_PAD | Freq: Every day | CUTANEOUS | Status: DC
  Administered 2024-05-12 – 2024-05-17 (×6): 6 via TOPICAL

## 2024-05-12 MED ORDER — ENSURE ENLIVE PO LIQD
237.0000 mL | Freq: Two times a day (BID) | ORAL | Status: DC
  Administered 2024-05-12 – 2024-05-17 (×7): 237 mL via ORAL

## 2024-05-12 MED ORDER — DICYCLOMINE HCL 10 MG PO CAPS
10.0000 mg | ORAL_CAPSULE | Freq: Once | ORAL | Status: AC
  Administered 2024-05-12: 10 mg via ORAL
  Filled 2024-05-12: qty 1

## 2024-05-12 MED ORDER — ORAL CARE MOUTH RINSE: 15.0000 mL | OROMUCOSAL | Status: DC | PRN

## 2024-05-12 MED ORDER — POLYETHYLENE GLYCOL 3350 17 G PO PACK
17.0000 g | PACK | Freq: Every day | ORAL | Status: DC
  Administered 2024-05-12 – 2024-05-16 (×5): 17 g via ORAL
  Filled 2024-05-12 (×6): qty 1

## 2024-05-12 MED ORDER — SODIUM CHLORIDE 0.9 % IV SOLN
12.5000 mg | Freq: Four times a day (QID) | INTRAVENOUS | Status: DC | PRN
  Administered 2024-05-12: 12.5 mg via INTRAVENOUS
  Filled 2024-05-12: qty 0.5

## 2024-05-12 MED ORDER — DOCUSATE SODIUM 100 MG PO CAPS
100.0000 mg | ORAL_CAPSULE | Freq: Every day | ORAL | Status: DC
  Administered 2024-05-12 – 2024-05-17 (×6): 100 mg via ORAL
  Filled 2024-05-12 (×6): qty 1

## 2024-05-12 MED ORDER — DM-GUAIFENESIN ER 30-600 MG PO TB12
1.0000 | ORAL_TABLET | Freq: Two times a day (BID) | ORAL | Status: DC
  Administered 2024-05-12 – 2024-05-17 (×11): 1 via ORAL
  Filled 2024-05-12 (×11): qty 1

## 2024-05-12 MED ORDER — SODIUM CHLORIDE 3 % IV SOLN
INTRAVENOUS | Status: DC
  Filled 2024-05-12 (×5): qty 500

## 2024-05-12 NOTE — Consult Note (Addendum)
 [facility]  CONSULT NOTE    Date: 05/12/2024                  Patient Name:  Heather Miranda  MRN: [id number]  DOB: 21-Oct-1929  Age / Sex: 89 y.o., female         PCP: Yehuda Helms, MD                 Service Requesting Consult: TRH                 Reason for Consult: Hyponatremia            History of Present Illness: Ms. Miranda MALER is a 89 y.o.  female with past medical conditions including GERD and hypertension, who was admitted to [facility] on 05/11/2024 for Hyponatremia [E87.1] Community acquired pneumonia of left lower lobe of lung [J18.9]  Patient seen sitting up in bed.  No family present at bedside.  Patient currently resides at an apartment in an independent living community.  States she has been feeling nauseous for the past 3 to 4 days.  Does endorse poor oral intake.  States she was drinking a lot of fluid to prevent dehydration.  Continues to complain of nausea at this time.  Breakfast tray at bedside, small portion of eggs consumed.  Reports she had progressive weakness as well.  Labs on ED arrival shows sodium 118.  UA appears clear.  Chest x-ray shows a large hiatal hernia.  Patient did receive IV fluids in emergency department.  Current order for 3% hypertonic saline, has not been started at time of visit.    Medications: Outpatient medications: Medications Prior to Admission  Medication Sig Dispense Refill Last Dose/Taking   amLODipine (NORVASC) 5 MG tablet Take 5 mg by mouth daily.   05/10/2024   aspirin 81 MG chewable tablet Chew by mouth daily.   05/10/2024   atenolol  (TENORMIN ) 25 MG tablet Take 25 mg by mouth 2 (two) times daily.    05/10/2024   calcium carbonate (OSCAL) 1500 (600 Ca) MG TABS tablet Take 600 mg of elemental calcium by mouth 2 (two) times daily with a meal.    05/10/2024   Cholecalciferol (D3-1000) 25 MCG (1000 UT) capsule Take 1,000 Units by mouth daily.   05/10/2024   fexofenadine (ALLEGRA) 60 MG tablet Take 60 mg by  mouth daily as needed for allergies.    Taking As Needed   losartan-hydrochlorothiazide (HYZAAR) 100-12.5 MG tablet Take 1 tablet by mouth daily.   05/10/2024   Magnesium 500 MG CAPS Take 500 mg by mouth daily.   05/10/2024   Multiple Vitamins-Minerals (CENTRUM SILVER PO) Take 1 tablet by mouth daily.   05/10/2024   omeprazole (PRILOSEC) 20 MG capsule Take 20 mg by mouth 2 (two) times daily before a meal.    05/10/2024    Current medications: Current Facility-Administered Medications  Medication Dose Route Frequency Provider Last Rate Last Admin   acetaminophen  (TYLENOL ) tablet 650 mg  650 mg Oral Q6H PRN Cox, Amy N, DO   650 mg at 05/12/24 0818   Or   acetaminophen  (TYLENOL ) suppository 650 mg  650 mg Rectal Q6H PRN Cox, Amy N, DO       amLODipine (NORVASC) tablet 5 mg  5 mg Oral Daily Cox, Amy N, DO   5 mg at 05/12/24 0818   aspirin chewable tablet 81 mg  81 mg Oral Daily Cox, Amy N, DO   81 mg  at 05/12/24 0818   atenolol  (TENORMIN ) tablet 25 mg  25 mg Oral BID Cox, Amy N, DO   25 mg at 05/11/24 2117   azithromycin (ZITHROMAX) 500 mg in sodium chloride  0.9 % 250 mL IVPB  500 mg Intravenous Q24H Cox, Amy N, DO 250 mL/hr at 05/12/24 1026 500 mg at 05/12/24 1026   calcium carbonate (OS-CAL - dosed in mg of elemental calcium) tablet 1,250 mg  500 mg of elemental calcium Oral BID WC Cox, Amy N, DO   1,250 mg at 05/12/24 7106   Chlorhexidine  Gluconate Cloth 2 % PADS 6 each  6 each Topical Daily Amin, Sumayya, MD   6 each at 05/12/24 1011   cholecalciferol (VITAMIN D3) 25 MCG (1000 UNIT) tablet 1,000 Units  1,000 Units Oral Daily Cox, Amy N, DO   1,000 Units at 05/12/24 0818   dextromethorphan-guaiFENesin  (MUCINEX  DM) 30-600 MG per 12 hr tablet 1 tablet  1 tablet Oral BID Amin, Sumayya, MD       docusate sodium  (COLACE) capsule 100 mg  100 mg Oral Daily Anola King, NP   100 mg at 05/12/24 1324   enoxaparin (LOVENOX) injection 40 mg  40 mg Subcutaneous QHS Cox, Amy N, DO   40 mg at 05/11/24 2117    feeding supplement (ENSURE ENLIVE / ENSURE PLUS) liquid 237 mL  237 mL Oral BID BM Breeze, Shantelle, NP       hydrALAZINE (APRESOLINE) injection 5 mg  5 mg Intravenous Q6H PRN Cox, Amy N, DO       losartan (COZAAR) tablet 100 mg  100 mg Oral Daily Cox, Amy N, DO   100 mg at 05/12/24 0818   magnesium oxide (MAG-OX) tablet 400 mg  400 mg Oral Daily Cox, Amy N, DO   400 mg at 05/12/24 0818   multivitamin with minerals tablet 1 tablet  1 tablet Oral Daily Cox, Amy N, DO   1 tablet at 05/12/24 0818   ondansetron  (ZOFRAN ) tablet 4 mg  4 mg Oral Q6H PRN Cox, Amy N, DO       Or   ondansetron  (ZOFRAN ) injection 4 mg  4 mg Intravenous Q6H PRN Cox, Amy N, DO   4 mg at 05/12/24 1010   Oral care mouth rinse  15 mL Mouth Rinse PRN Luna Salinas, MD       pantoprazole  (PROTONIX ) EC tablet 40 mg  40 mg Oral BID AC Cox, Amy N, DO   40 mg at 05/12/24 0818   promethazine (PHENERGAN) 12.5 mg in sodium chloride  0.9 % 50 mL IVPB  12.5 mg Intravenous Q6H PRN Amin, Sumayya, MD 150 mL/hr at 05/12/24 1323 12.5 mg at 05/12/24 1323   sodium chloride  (hypertonic) 3 % solution   Intravenous Continuous Luna Salinas, MD 50 mL/hr at 05/12/24 1107 New Bag at 05/12/24 1107      Allergies: Allergies  Allergen Reactions   Morphine And Codeine Nausea Only   Oxycontin [Oxycodone] Nausea Only   Percocet [Oxycodone-Acetaminophen ] Nausea Only   Morphine Nausea Only   Oxycodone-Acetaminophen  Rash      Past Medical History: Past Medical History:  Diagnosis Date   Arthritis    Dysrhythmia    Environmental and seasonal allergies    GERD (gastroesophageal reflux disease)    Hernia, hiatal    Hx of dysplastic nevus 09/13/2015   L mid back paraspinal, moderate atypia     Past Surgical History: Past Surgical History:  Procedure Laterality Date   abd cyst removed  BACK SURGERY  09/29/2017   CATARACT EXTRACTION W/ INTRAOCULAR LENS  IMPLANT, BILATERAL     DUPUYTREN CONTRACTURE RELEASE Left    KNEE SURGERY      right   LUMBAR LAMINECTOMY/DECOMPRESSION MICRODISCECTOMY N/A 09/29/2017   Procedure: LUMBAR LAMINECTOMY/DECOMPRESSION MICRODISCECTOMY 1 LEVEL L4-5;  Surgeon: Jodeen Munch, MD;  Location: [facility] ORS;  Service: Neurosurgery;  Laterality: N/A;   ROTATOR CUFF REPAIR Right    WRIST SURGERY     right     Family History: Family History  Problem Relation Age of Onset   Breast cancer Mother 67   Breast cancer Maternal Aunt      Social History: Social History   Socioeconomic History   Marital status: Widowed    Spouse name: Not on file   Number of children: Not on file   Years of education: Not on file   Highest education level: Not on file  Occupational History   Not on file  Tobacco Use   Smoking status: Never   Smokeless tobacco: Never  Vaping Use   Vaping status: Never Used  Substance and Sexual Activity   Alcohol use: Yes    Alcohol/week: 4.0 standard drinks of alcohol    Types: 4 Glasses of wine per week   Drug use: No   Sexual activity: Not Currently  Other Topics Concern   Not on file  Social History Narrative   Not on file   Social Drivers of Health   Financial Resource Strain: Not on file  Food Insecurity: No Food Insecurity (05/11/2024)   Hunger Vital Sign    Worried About Running Out of Food in the Last Year: Never true    Ran Out of Food in the Last Year: Never true  Transportation Needs: No Transportation Needs (05/11/2024)   PRAPARE - Administrator, Civil Service (Medical): No    Lack of Transportation (Non-Medical): No  Physical Activity: Not on file  Stress: Not on file  Social Connections: Moderately Integrated (05/11/2024)   Social Connection and Isolation Panel [NHANES]    Frequency of Communication with Friends and Family: Twice a week    Frequency of Social Gatherings with Friends and Family: Once a week    Attends Religious Services: More than 4 times per year    Active Member of [employer] or Organizations: Yes    Attends Tax inspector Meetings: More than 4 times per year    Marital Status: Widowed  Intimate Partner Violence: Not At Risk (05/11/2024)   Humiliation, Afraid, Rape, and Kick questionnaire    Fear of Current or Ex-Partner: No    Emotionally Abused: No    Physically Abused: No    Sexually Abused: No     Review of Systems: Review of Systems  Constitutional:  Negative for chills, fever and malaise/fatigue.  HENT:  Negative for congestion, sore throat and tinnitus.   Eyes:  Negative for blurred vision and redness.  Respiratory:  Negative for cough, shortness of breath and wheezing.   Cardiovascular:  Negative for chest pain, palpitations, claudication and leg swelling.  Gastrointestinal:  Positive for nausea and vomiting. Negative for abdominal pain, blood in stool and diarrhea.  Genitourinary:  Negative for flank pain, frequency and hematuria.  Musculoskeletal:  Negative for back pain, falls and myalgias.  Skin:  Negative for rash.  Neurological:  Positive for weakness. Negative for dizziness and headaches.  Endo/Heme/Allergies:  Does not bruise/bleed easily.  Psychiatric/Behavioral:  Negative for depression. The patient is not nervous/anxious and  does not have insomnia.     Vital Signs: Blood pressure 137/64, pulse 66, temperature 98.2 F (36.8 C), temperature source Oral, resp. rate 18, height 5' (1.524 m), weight 59.7 kg, SpO2 91%.  Weight trends: Filed Weights   05/11/24 1052 05/12/24 0955  Weight: 56.7 kg 59.7 kg    Physical Exam: General: Ill-appearing  Head: Normocephalic, atraumatic.  Dry oral mucosal membranes  Eyes: Anicteric  Lungs:  Clear to auscultation, normal effort  Heart: Regular rate and rhythm  Abdomen:  Soft, nontender,   Extremities: No peripheral edema.  Neurologic: Alert and oriented,, moving all four extremities  Skin: No lesions, poor skin turgor        Lab results: Basic Metabolic Panel: Recent Labs  Lab 05/11/24 1058 05/12/24 0510 05/12/24 0815  05/12/24 1004  NA 118* 118* 118* 117*  K 3.5 4.1  --   --   CL 80* 85*  --   --   CO2 26 25  --   --   GLUCOSE 117* 93  --   --   BUN 15 9  --   --   CREATININE 0.63 0.46  --   --   CALCIUM 9.1 8.2*  --   --     Liver Function Tests: No results for input(s): "AST", "ALT", "ALKPHOS", "BILITOT", "PROT", "ALBUMIN" in the last 168 hours. No results for input(s): "LIPASE", "AMYLASE" in the last 168 hours. No results for input(s): "AMMONIA" in the last 168 hours.  CBC: Recent Labs  Lab 05/11/24 1058 05/12/24 0510  WBC 12.0* 9.4  NEUTROABS 8.2*  --   HGB 15.1* 13.9  HCT 41.6 38.3  MCV 85.8 85.9  PLT 350 300    Cardiac Enzymes: No results for input(s): "CKTOTAL", "CKMB", "CKMBINDEX", "TROPONINI" in the last 168 hours.  BNP: Invalid input(s): "POCBNP"  CBG: Recent Labs  Lab 05/12/24 0958 05/12/24 1226  GLUCAP 158* 138*    Microbiology: Results for orders placed or performed during the hospital encounter of 05/11/24  MRSA Next Gen by PCR, Nasal     Status: None   Collection Time: 05/11/24  7:00 PM   Specimen: Nasal Mucosa; Nasal Swab  Result Value Ref Range Status   MRSA by PCR Next Gen NOT DETECTED NOT DETECTED Final    Comment: (NOTE) The GeneXpert MRSA Assay (FDA approved for NASAL specimens only), is one component of a comprehensive MRSA colonization surveillance program. It is not intended to diagnose MRSA infection nor to guide or monitor treatment for MRSA infections. Test performance is not FDA approved in patients less than 52 years old. Performed at [facility], [address] [place], [place] [address]   MRSA Next Gen by PCR, Nasal     Status: None   Collection Time: 05/12/24  9:59 AM   Specimen: Nasal Mucosa; Nasal Swab  Result Value Ref Range Status   MRSA by PCR Next Gen NOT DETECTED NOT DETECTED Final    Comment: (NOTE) The GeneXpert MRSA Assay (FDA approved for NASAL specimens only), is one component of a comprehensive MRSA  colonization surveillance program. It is not intended to diagnose MRSA infection nor to guide or monitor treatment for MRSA infections. Test performance is not FDA approved in patients less than 66 years old. Performed at [facility], [address] [place], [place] [address]     Coagulation Studies: No results for input(s): "LABPROT", "INR" in the last 72 hours.  Urinalysis: Recent Labs    05/11/24 1349  COLORURINE  STRAW*  LABSPEC 1.005  PHURINE 7.0  GLUCOSEU NEGATIVE  HGBUR NEGATIVE  BILIRUBINUR NEGATIVE  KETONESUR NEGATIVE  PROTEINUR NEGATIVE  NITRITE NEGATIVE  LEUKOCYTESUR NEGATIVE      Imaging: DG Chest Portable 1 View Result Date: 05/11/2024 CLINICAL DATA:  Shortness of breath, cough. EXAM: PORTABLE CHEST 1 VIEW COMPARISON:  September 21, 2017. FINDINGS: Stable cardiomegaly. Large hiatal hernia is noted. Lungs are clear. Bony thorax is unremarkable. IMPRESSION: Large hiatal hernia.  No acute pulmonary disease. Electronically Signed   By: Rosalene Colon M.D.   On: 05/11/2024 12:30     Assessment & Plan: Ms. MELAYAH LUSKY is a 89 y.o.  female with past medical conditions including hypertension and GERD, who was admitted to [facility] on 05/11/2024 for Hyponatremia [E87.1] Community acquired pneumonia of left lower lobe of lung [J18.9]  Hyponatremia likely secondary to increased free water intake in the setting of GI illness.  Patient reports decreased oral intake of solid food.  Sodium 118 on ED arrival.  Patient given normal saline bolus without relief of symptoms.  Agree with 3% hypertonic saline.  Continue frequent monitoring of sodium levels.  Goal of correction 8 to 10 mmol per 24 hours.  Clinically patient still appears somewhat hypovolemic although sodium did not correct with IV saline administration yesterday.  Encouraged oral solid food intake.  Will not place patient on fluid restriction at this time.    LOS: 1 Shantelle Breeze 5/16/20251:53 PM    Patient was seen and examined with Anola King, NP.  Plan of care was formulated for the problems addressed and discussed with NP.  I agree with the note as documented except as noted below.

## 2024-05-12 NOTE — Progress Notes (Signed)
 MEDICATION RELATED CONSULT NOTE - INITIAL   Pharmacy Consult for monitoring of sodium levels on 3% saline Indication: hyponatremia  Allergies  Allergen Reactions   Morphine And Codeine Nausea Only   Oxycontin [Oxycodone] Nausea Only   Percocet [Oxycodone-Acetaminophen ] Nausea Only   Morphine Nausea Only   Oxycodone-Acetaminophen  Rash    Patient Measurements: Height: 5' (152.4 cm) Weight: 59.7 kg (131 lb 9.8 oz) IBW/kg (Calculated) : 45.5  Vital Signs: Temp: 98.2 F (36.8 C) (05/16 0955) Temp Source: Oral (05/16 0955) BP: 149/60 (05/16 0738) Pulse Rate: 55 (05/16 0738) Intake/Output from previous day: 05/15 0701 - 05/16 0700 In: 1350 [IV Piggyback:1350] Out: -  Intake/Output from this shift: Total I/O In: -  Out: 50 [Urine:50]  Labs: Recent Labs    05/11/24 1058 05/12/24 0510  WBC 12.0* 9.4  HGB 15.1* 13.9  HCT 41.6 38.3  PLT 350 300  CREATININE 0.63 0.46   Estimated Creatinine Clearance: 35.5 mL/min (by C-G formula based on SCr of 0.46 mg/dL).   Microbiology: Recent Results (from the past 720 hours)  MRSA Next Gen by PCR, Nasal     Status: None   Collection Time: 05/11/24  7:00 PM   Specimen: Nasal Mucosa; Nasal Swab  Result Value Ref Range Status   MRSA by PCR Next Gen NOT DETECTED NOT DETECTED Final    Comment: (NOTE) The GeneXpert MRSA Assay (FDA approved for NASAL specimens only), is one component of a comprehensive MRSA colonization surveillance program. It is not intended to diagnose MRSA infection nor to guide or monitor treatment for MRSA infections. Test performance is not FDA approved in patients less than 34 years old. Performed at [facility], [address] [place], [place] [address]     Medical History: Past Medical History:  Diagnosis Date   Arthritis    Dysrhythmia    Environmental and seasonal allergies    GERD (gastroesophageal reflux disease)    Hernia, hiatal    Hx of dysplastic nevus 09/13/2015   L mid back  paraspinal, moderate atypia    Medications:  Scheduled:   amLODipine  5 mg Oral Daily   aspirin  81 mg Oral Daily   atenolol   25 mg Oral BID   calcium carbonate  500 mg of elemental calcium Oral BID WC   Chlorhexidine  Gluconate Cloth  6 each Topical Daily   cholecalciferol  1,000 Units Oral Daily   enoxaparin (LOVENOX) injection  40 mg Subcutaneous QHS   losartan  100 mg Oral Daily   magnesium oxide  400 mg Oral Daily   multivitamin with minerals  1 tablet Oral Daily   pantoprazole   40 mg Oral BID AC    Assessment: 89 year old female with history of hypertension, GERD, who presents from [facility] for chief concerns of cough, congestion for 2 weeks. Hyponatremia noted on admission and 3% saline was started  3% sodium chloride  started at 50 mL/hr at 1107 05/16  date/time level action 05/16 1004 118 hypertonic saline initiated 05/16 1345 119 continue at 50 mL/hr  Goal of Therapy:  Increase Na by 8 - 10 mmol/L in 24 hours  Plan:  Continue 3% sodium chloride  at 50 mL/hr  Monitor Na q2h x 2 occurrences (completed), then q4h Call RN to stop infusion and notify MD if  Na increases by 4 mEq/L or more in the first 2 hours OR Na increases by 6 mEq/L or more in the first 4 hours   Adalberto Acton 05/12/2024,10:22 AM

## 2024-05-12 NOTE — Progress Notes (Signed)
 Patient with cell phone, wedding band and eyeglasses on self. Watch removed and placed in belongings bag. Patient with clothing, briefs, and personal toiletries in bag.  Belongings policy explained to patient.

## 2024-05-12 NOTE — Plan of Care (Signed)

## 2024-05-12 NOTE — Progress Notes (Signed)
 Progress Note   Patient: Heather Miranda YQI:[id number] DOB: 09-24-1929 DOA: 05/11/2024     1 DOS: the patient was seen and examined on 05/12/2024   Brief hospital course: Ms. Ilham Means is a 89 year old female with history of hypertension, GERD, who presents from [facility] for chief concerns of cough, congestion for 2 weeks.  Vitals in the ED showed T of 90, rr 23, hr 72, blood pressure 145/74, SpO2 of 94% on room air.  Serum sodium is 118, potassium 3.5, chloride 80, bicarb 26, BUN of 15, serum creatinine 2.63, EGFR greater than 60, nonfasting blood glucose 117, WBC 12, hemoglobin 15.1, platelets of 350. BNP was 99.  ED treatment: Azithromycin 500 mg IV one-time dose, ceftriaxone IV one-time dose, sodium chloride  1 L bolus.  5/16: Vitals stable, sodium remained at 118 after getting 1 L of normal saline. Worsening weakness, hyponatremia labs ordered and transferring to stepdown to start on hypertonic saline.  Nephrology was consulted. Patient was on HCTZ at home which should not be continued on discharge  Assessment and Plan: * Hyponatremia Acute on chronic hyponatremia.  Sodium with some worsening to 117 this morning.  S/p 1 L of normal saline in ED. Was on HCTZ and seems like having slowly worsening chronic hyponatremia, HCTZ should not be continued at this time. Hyponatremia labs with hypotonic hyponatremia, normal urine osmolality and urinary sodium pending. - Transferring to stepdown -Start her on hypertonic saline -Nephrology consult -Monitor sodium -Goal increase of 8-10 in 24-hour  CAP (community acquired pneumonia) Patient with history of upper respiratory symptoms for the past couple of weeks, received 2 courses of antibiotics. Chest x-ray without any abnormality and procalcitonin negative Strep pneumo negative, MRSA PCR negative -Discontinue antibiotics -Supportive care  Benign essential hypertension Home amlodipine 5 mg daily, atenolol  25 mg p.o. twice  daily, losartan 100 mg daily resumed Discontinuing home HCTZ for concern of progressively worsening chronic hyponatremia, HCTZ should not be resumed on discharge Hydralazine 5 mg IV every 6 hours as needed for SBP greater than 170, 5 days ordered  Type 2 diabetes mellitus without complication (HCC) Patient is current not on insulin and or oral antiglycemic agents CBG within goal -Continue to monitor  GERD (gastroesophageal reflux disease) - Continue home PPI   Subjective: Patient was complaining of some nausea but no vomiting.  Continue to have mild cough.  Continue to feel very weak.  Physical Exam: Vitals:   05/12/24 0419 05/12/24 0738 05/12/24 0955 05/12/24 1200  BP: (!) 146/66 (!) 149/60  137/64  Pulse: (!) 54 (!) 55  66  Resp: 18 16 18 18   Temp: 98 F (36.7 C)  98.2 F (36.8 C)   TempSrc:   Oral   SpO2: 96% 95%  91%  Weight:   59.7 kg   Height:       General.  Frail elderly lady, in no acute distress. Pulmonary.  Lungs clear bilaterally, normal respiratory effort. CV.  Regular rate and rhythm, no JVD, rub or murmur. Abdomen.  Soft, nontender, nondistended, BS positive. CNS.  Alert and oriented .  No focal neurologic deficit. Extremities.  No edema, no cyanosis, pulses intact and symmetrical.  Data Reviewed: Prior data reviewed  Family Communication: Talked with son on phone.  Disposition: Status is: Inpatient Remains inpatient appropriate because: Severity of illness  Planned Discharge Destination: Home with Home Health  DVT prophylaxis.  Lovenox  Time spent: 50 minutes  This record has been created using Conservation officer, historic buildings. Errors have  been sought and corrected,but may not always be located. Such creation errors do not reflect on the standard of care.   Author: Luna Salinas, MD 05/12/2024 1:12 PM  For on call review [email or web address].

## 2024-05-12 NOTE — Progress Notes (Addendum)
 OT Cancellation Note  Patient Details Name: Heather Miranda MRN: [id number] DOB: Feb 06, 1929   Cancelled Treatment:    Reason Eval/Treat Not Completed: Patient not medically ready. Consult received, chart reviewed. Pt with Na+ 118. In secure chat with team, MD requesting hold therapy at this time. Will re-attempt at later date/time as pt is appropriate.   Addendum, 10:37am: Pt transferred from 1C to ICU. Due to need for higher level of care, will require new OT consult once medically appropriate. Will sign off at this time.     Shoji Pertuit R., MPH, MS, OTR/L ascom [phone] 05/12/24, 8:44 AM

## 2024-05-12 NOTE — Plan of Care (Signed)
  Problem: Education: Goal: Knowledge of General Education information will improve Description: Including pain rating scale, medication(s)/side effects and non-pharmacologic comfort measures Outcome: Progressing   Problem: Health Behavior/Discharge Planning: Goal: Ability to manage health-related needs will improve Outcome: Progressing   Problem: Clinical Measurements: Goal: Ability to maintain clinical measurements within normal limits will improve Outcome: Progressing Goal: Will remain free from infection Outcome: Progressing Goal: Diagnostic test results will improve Outcome: Progressing Goal: Respiratory complications will improve Outcome: Progressing Goal: Cardiovascular complication will be avoided Outcome: Progressing   Problem: Coping: Goal: Level of anxiety will decrease Outcome: Progressing   Problem: Elimination: Goal: Will not experience complications related to bowel motility Outcome: Not Progressing   Problem: Pain Managment: Goal: General experience of comfort will improve and/or be controlled Outcome: Progressing

## 2024-05-12 NOTE — Progress Notes (Addendum)
 PT Cancellation Note  Patient Details Name: MCKENLEY GAUVIN MRN: [id number] DOB: 07-14-1929   Cancelled Treatment:    Reason Eval/Treat Not Completed: Patient not medically ready Consult received, chart reviewed. Pt with Na+ 118. In secure chat with team, MD requesting hold therapy at this time. Will re-attempt at later date/time as pt is appropriate  Addendum: ~10:30 - pt has been transferred to CCU, will complete PT orders at this time, will need new orders when ready.   Darice Edelman, DPT 05/12/2024, 8:51 AM

## 2024-05-13 DIAGNOSIS — K219 Gastro-esophageal reflux disease without esophagitis: Secondary | ICD-10-CM | POA: Diagnosis not present

## 2024-05-13 DIAGNOSIS — E871 Hypo-osmolality and hyponatremia: Secondary | ICD-10-CM | POA: Diagnosis not present

## 2024-05-13 DIAGNOSIS — E119 Type 2 diabetes mellitus without complications: Secondary | ICD-10-CM | POA: Diagnosis not present

## 2024-05-13 DIAGNOSIS — I10 Essential (primary) hypertension: Secondary | ICD-10-CM | POA: Diagnosis not present

## 2024-05-13 LAB — RESPIRATORY PANEL BY PCR

## 2024-05-13 LAB — CBC
HCT: 38.1 % (ref 36.0–46.0)
Hemoglobin: 13.5 g/dL (ref 12.0–15.0)
MCH: 31 pg (ref 26.0–34.0)
MCHC: 35.4 g/dL (ref 30.0–36.0)
MCV: 87.4 fL (ref 80.0–100.0)
Platelets: 291 10*3/uL (ref 150–400)
RBC: 4.36 MIL/uL (ref 3.87–5.11)
RDW: 13.1 % (ref 11.5–15.5)
WBC: 9 10*3/uL (ref 4.0–10.5)
nRBC: 0 % (ref 0.0–0.2)

## 2024-05-13 LAB — BASIC METABOLIC PANEL WITH GFR
Anion gap: 7 (ref 5–15)
BUN: 7 mg/dL — ABNORMAL LOW (ref 8–23)
CO2: 21 mmol/L — ABNORMAL LOW (ref 22–32)
Calcium: 7.8 mg/dL — ABNORMAL LOW (ref 8.9–10.3)
Chloride: 100 mmol/L (ref 98–111)
Creatinine, Ser: 0.5 mg/dL (ref 0.44–1.00)
GFR, Estimated: >60 mL/min (ref >60)
Glucose, Bld: 86 mg/dL (ref 70–99)
Potassium: 3.5 mmol/L (ref 3.5–5.1)
Sodium: 128 mmol/L — ABNORMAL LOW (ref 135–145)

## 2024-05-13 LAB — CORTISOL-AM, BLOOD: Cortisol - AM: 8.5 ug/dL (ref 6.7–22.6)

## 2024-05-13 LAB — SODIUM
Sodium: 125 mmol/L — ABNORMAL LOW (ref 135–145)
Sodium: 125 mmol/L — ABNORMAL LOW (ref 135–145)

## 2024-05-13 MED ORDER — SODIUM CHLORIDE 0.9 % IV SOLN: INTRAVENOUS | Status: AC

## 2024-05-13 NOTE — Assessment & Plan Note (Signed)
 Patient with history of upper respiratory symptoms for the past couple of weeks, received 2 courses of antibiotics. Chest x-ray without any abnormality and procalcitonin negative Strep pneumo negative, MRSA PCR negative -Discontinue antibiotics -Supportive care

## 2024-05-13 NOTE — Plan of Care (Signed)

## 2024-05-13 NOTE — Progress Notes (Signed)
 Progress Note   Patient: TORII ROYSE ZOX:[id number] DOB: 1929/04/10 DOA: 05/11/2024     2 DOS: the patient was seen and examined on 05/13/2024   Brief hospital course: Ms. Nayali Talerico is a 89 year old female with history of hypertension, GERD, who presents from [facility] for chief concerns of cough, congestion for 2 weeks.  Vitals in the ED showed T of 90, rr 23, hr 72, blood pressure 145/74, SpO2 of 94% on room air.  Serum sodium is 118, potassium 3.5, chloride 80, bicarb 26, BUN of 15, serum creatinine 2.63, EGFR greater than 60, nonfasting blood glucose 117, WBC 12, hemoglobin 15.1, platelets of 350. BNP was 99.  ED treatment: Azithromycin  500 mg IV one-time dose, ceftriaxone  IV one-time dose, sodium chloride  1 L bolus.  5/16: Vitals stable, sodium remained at 118 after getting 1 L of normal saline. Worsening weakness, hyponatremia labs ordered and transferring to stepdown to start on hypertonic saline.  Nephrology was consulted. Patient was on HCTZ at home which should not be continued on discharge.  5/17: Hemodynamically stable, sodium improved to 128.  Hypertonic saline was discontinued and she was started on normal saline at 40 mL/h. Expanded respiratory panel negative.  Pending PT and OT evaluation  Assessment and Plan: * Hyponatremia Acute on chronic hyponatremia.  Sodium improved to 128 today with hypertonic saline Was on HCTZ and seems like having slowly worsening chronic hyponatremia, HCTZ should not be continued at this time. Hyponatremia labs with hypotonic hyponatremia, normal urine osmolality and urinary sodium of 106. - Discontinuing hypertonic saline -Transfer her to MedSurg -Start on normal saline at 40 mL/h -Monitor sodium -Encourage to drink more water with electrolyte instead of just free water -Patient should not be restarted on HCTZ  CAP (community acquired pneumonia) Patient with history of upper respiratory symptoms for the past couple of weeks,  received 2 courses of antibiotics. Chest x-ray without any abnormality and procalcitonin negative Strep pneumo negative, MRSA PCR negative -Discontinue antibiotics -Supportive care  Benign essential hypertension Home amlodipine  5 mg daily, atenolol  25 mg p.o. twice daily, losartan  100 mg daily resumed Discontinuing home HCTZ for concern of progressively worsening chronic hyponatremia, HCTZ should not be resumed on discharge Hydralazine  5 mg IV every 6 hours as needed for SBP greater than 170, 5 days ordered  Type 2 diabetes mellitus without complication (HCC) Patient is current not on insulin and or oral antiglycemic agents CBG within goal -Continue to monitor  GERD (gastroesophageal reflux disease) - Continue home PPI   Subjective: Patient was feeling much improved when seen today.  Denies any pain.  Physical Exam: Vitals:   05/13/24 0800 05/13/24 0906 05/13/24 1000 05/13/24 1132  BP: 125/65 (!) 122/57 (!) 147/73 (!) 109/56  Pulse: 81 74 84 66  Resp: 18  17 16   Temp: 98.7 F (37.1 C)   97.8 F (36.6 C)  TempSrc: Oral   Oral  SpO2: 92%  94% 94%  Weight:      Height:       General.  Frail elderly lady, in no acute distress. Pulmonary.  Lungs clear bilaterally, normal respiratory effort. CV.  Regular rate and rhythm, no JVD, rub or murmur. Abdomen.  Soft, nontender, nondistended, BS positive. CNS.  Alert and oriented .  No focal neurologic deficit. Extremities.  No edema, no cyanosis, pulses intact and symmetrical.  Data Reviewed: Prior data reviewed  Family Communication: Talked with daughter on phone.  Disposition: Status is: Inpatient Remains inpatient appropriate because: Severity of illness  Planned Discharge Destination: Home with Home Health  DVT prophylaxis.  Lovenox   Time spent: 50 minutes  This record has been created using Conservation officer, historic buildings. Errors have been sought and corrected,but may not always be located. Such creation errors do not  reflect on the standard of care.   Author: Luna Salinas, MD 05/13/2024 1:56 PM  For on call review [email or web address].

## 2024-05-13 NOTE — Progress Notes (Signed)
 [facility]  PROGRESS NOTE   Subjective:   Patient seen at bedside in the ICU.  On hypertonic saline for hyponatremia.  Patient is awake and alert oriented x 3.  Sodium improved to 128 today.  Objective:  Vital signs: Blood pressure (!) 122/57, pulse 74, temperature 98.7 F (37.1 C), temperature source Oral, resp. rate 18, height 5' (1.524 m), weight 59.7 kg, SpO2 92%.  Intake/Output Summary (Last 24 hours) at 05/13/2024 1046 Last data filed at 05/13/2024 0701 Gross per 24 hour  Intake 2060.67 ml  Output 2220 ml  Net -159.33 ml   Filed Weights   05/11/24 1052 05/12/24 0955  Weight: 56.7 kg 59.7 kg     Physical Exam: General:  No acute distress  Head:  Normocephalic, atraumatic. Moist oral mucosal membranes  Eyes:  Anicteric  Neck:  Supple  Lungs:   Clear to auscultation, normal effort  Heart:  S1S2 no rubs  Abdomen:   Soft, nontender, bowel sounds present  Extremities:  peripheral edema.  Neurologic:  Awake, alert, following commands  Skin:  No lesions  Access:     Basic Metabolic Panel: Recent Labs  Lab 05/11/24 1058 05/12/24 0510 05/12/24 0815 05/12/24 1345 05/12/24 1815 05/12/24 2218 05/13/24 0148 05/13/24 0715  NA 118* 118*   < > 119* 120* 124* 125* 128*  K 3.5 4.1  --   --   --   --   --  3.5  CL 80* 85*  --   --   --   --   --  100  CO2 26 25  --   --   --   --   --  21*  GLUCOSE 117* 93  --   --   --   --   --  86  BUN 15 9  --   --   --   --   --  7*  CREATININE 0.63 0.46  --   --   --   --   --  0.50  CALCIUM  9.1 8.2*  --   --   --   --   --  7.8*   < > = values in this interval not displayed.   GFR: Estimated Creatinine Clearance: 35.5 mL/min (by C-G formula based on SCr of 0.5 mg/dL).  Liver Function Tests: No results for input(s): "AST", "ALT", "ALKPHOS", "BILITOT", "PROT", "ALBUMIN" in the last 168 hours. No results for input(s): "LIPASE", "AMYLASE" in the last 168 hours. No results for input(s): "AMMONIA" in the last 168  hours.  CBC: Recent Labs  Lab 05/11/24 1058 05/12/24 0510 05/13/24 0715  WBC 12.0* 9.4 9.0  NEUTROABS 8.2*  --   --   HGB 15.1* 13.9 13.5  HCT 41.6 38.3 38.1  MCV 85.8 85.9 87.4  PLT 350 300 291     HbA1C: No results found for: "HGBA1C"  Urinalysis: Recent Labs    05/11/24 1349  COLORURINE STRAW*  LABSPEC 1.005  PHURINE 7.0  GLUCOSEU NEGATIVE  HGBUR NEGATIVE  BILIRUBINUR NEGATIVE  KETONESUR NEGATIVE  PROTEINUR NEGATIVE  NITRITE NEGATIVE  LEUKOCYTESUR NEGATIVE      Imaging: DG Chest Portable 1 View Result Date: 05/11/2024 CLINICAL DATA:  Shortness of breath, cough. EXAM: PORTABLE CHEST 1 VIEW COMPARISON:  September 21, 2017. FINDINGS: Stable cardiomegaly. Large hiatal hernia is noted. Lungs are clear. Bony thorax is unremarkable. IMPRESSION: Large hiatal hernia.  No acute pulmonary disease. Electronically Signed   By: Rosalene Colon M.D.   On:  05/11/2024 12:30     Medications:    azithromycin  500 mg (05/13/24 0942)   promethazine  (PHENERGAN ) injection (IM or IVPB) Stopped (05/12/24 1345)   sodium chloride  (hypertonic) 50 mL/hr at 05/13/24 0910    amLODipine   5 mg Oral Daily   aspirin   81 mg Oral Daily   atenolol   25 mg Oral BID   calcium  carbonate  500 mg of elemental calcium  Oral BID WC   Chlorhexidine  Gluconate Cloth  6 each Topical Daily   cholecalciferol   1,000 Units Oral Daily   dextromethorphan -guaiFENesin   1 tablet Oral BID   docusate sodium   100 mg Oral Daily   enoxaparin  (LOVENOX ) injection  40 mg Subcutaneous QHS   feeding supplement  237 mL Oral BID BM   losartan   100 mg Oral Daily   magnesium  oxide  400 mg Oral Daily   multivitamin with minerals  1 tablet Oral Daily   pantoprazole   40 mg Oral BID AC   polyethylene glycol  17 g Oral Daily    Assessment/ Plan:     89 year old female with history of hypertension, coronary artery disease, congestive heart failure, gastroesophageal reflux disease now admitted with history of left lower lobe  pneumonia.  She was admitted with nausea, poor oral intake and weakness.  On admission she has a sodium of 118.  She was started on 3% saline.  #1: Hyponatremia: Hyponatremia is most likely secondary to SIADH secondary to pneumonia complicated by intake of hydrochlorothiazide as outpatient.  Will discontinue the 3% hypertonic saline and started on isotonic saline at 50 cc an hour.  Will continue fluid restriction to 1000 cc a day.  #2: Hypertension: Will continue amlodipine  and losartan  at the present doses.  #3: Community-acquired pneumonia: Continue azithromycin  as ordered.  Continue to follow closely.  Labs and medications reviewed. Will continue to follow along with you.   LOS: 2 Worthy Heads, MD [facility] 5/17/202510:46 AM

## 2024-05-13 NOTE — Assessment & Plan Note (Addendum)
 Acute on chronic hyponatremia.  Sodium improved to 128 today after getting a dose of tolvaptan  on 5/19 Was on HCTZ and seems like having slowly worsening chronic hyponatremia, HCTZ should not be continued at this time. Hyponatremia labs with hypotonic hyponatremia, normal urine osmolality and urinary sodium of 106. Initially received hypertonic saline which was discontinued once sodium reached 128, started trending down again -S/p 1 dose of tolvaptan  on 5/19 - Continue salt tablet -Monitor sodium -Encourage to drink more water with electrolyte instead of just free water -Patient should not be restarted on HCTZ

## 2024-05-13 NOTE — Assessment & Plan Note (Addendum)
 Blood pressure mildly elevated Increasing the dose of amlodipine  to 10 mg daily Continue home atenolol  25 mg p.o. twice daily, losartan  100 mg daily  Discontinuing home HCTZ for concern of progressively worsening chronic hyponatremia, HCTZ should not be resumed on discharge Hydralazine  5 mg IV every 6 hours as needed for SBP greater than 170, 5 days ordered

## 2024-05-14 DIAGNOSIS — E119 Type 2 diabetes mellitus without complications: Secondary | ICD-10-CM | POA: Diagnosis not present

## 2024-05-14 DIAGNOSIS — E871 Hypo-osmolality and hyponatremia: Secondary | ICD-10-CM | POA: Diagnosis not present

## 2024-05-14 DIAGNOSIS — K219 Gastro-esophageal reflux disease without esophagitis: Secondary | ICD-10-CM | POA: Diagnosis not present

## 2024-05-14 DIAGNOSIS — I10 Essential (primary) hypertension: Secondary | ICD-10-CM | POA: Diagnosis not present

## 2024-05-14 LAB — SODIUM
Sodium: 122 mmol/L — ABNORMAL LOW (ref 135–145)
Sodium: 124 mmol/L — ABNORMAL LOW (ref 135–145)
Sodium: 125 mmol/L — ABNORMAL LOW (ref 135–145)

## 2024-05-14 LAB — LEGIONELLA PNEUMOPHILA SEROGP 1 UR AG: L. pneumophila Serogp 1 Ur Ag: NEGATIVE

## 2024-05-14 MED ORDER — SODIUM CHLORIDE 1 G PO TABS
1.0000 g | ORAL_TABLET | Freq: Three times a day (TID) | ORAL | Status: DC
  Administered 2024-05-14 – 2024-05-17 (×9): 1 g via ORAL
  Filled 2024-05-14 (×9): qty 1

## 2024-05-14 MED ORDER — AZITHROMYCIN 250 MG PO TABS
500.0000 mg | ORAL_TABLET | Freq: Every day | ORAL | Status: AC
  Administered 2024-05-14 – 2024-05-15 (×2): 500 mg via ORAL
  Filled 2024-05-14 (×2): qty 2

## 2024-05-14 MED ORDER — LACTULOSE 10 GM/15ML PO SOLN
20.0000 g | Freq: Two times a day (BID) | ORAL | Status: DC | PRN
  Administered 2024-05-14: 20 g via ORAL
  Filled 2024-05-14: qty 30

## 2024-05-14 MED ORDER — BENZONATATE 100 MG PO CAPS
100.0000 mg | ORAL_CAPSULE | Freq: Two times a day (BID) | ORAL | Status: DC
  Administered 2024-05-14 – 2024-05-17 (×7): 100 mg via ORAL
  Filled 2024-05-14 (×7): qty 1

## 2024-05-14 NOTE — Plan of Care (Signed)

## 2024-05-14 NOTE — Progress Notes (Signed)
 [facility]  PROGRESS NOTE   Subjective:   Out of bed to chair..  Complains of constipation.  Urine output has been fair.  Objective:  Vital signs: Blood pressure (!) 156/64, pulse 64, temperature (!) 97.5 F (36.4 C), temperature source Oral, resp. rate 14, height 5' (1.524 m), weight 59.7 kg, SpO2 99%.  Intake/Output Summary (Last 24 hours) at 05/14/2024 1353 Last data filed at 05/14/2024 1127 Gross per 24 hour  Intake --  Output 2325 ml  Net -2325 ml   Filed Weights   05/11/24 1052 05/12/24 0955  Weight: 56.7 kg 59.7 kg     Physical Exam: General:  No acute distress  Head:  Normocephalic, atraumatic. Moist oral mucosal membranes  Eyes:  Anicteric  Neck:  Supple  Lungs:   Clear to auscultation, normal effort  Heart:  S1S2 no rubs  Abdomen:   Soft, nontender, bowel sounds present  Extremities:  peripheral edema.  Neurologic:  Awake, alert, following commands  Skin:  No lesions  Access:     Basic Metabolic Panel: Recent Labs  Lab 05/11/24 1058 05/12/24 0510 05/12/24 0815 05/13/24 0148 05/13/24 0715 05/13/24 1558 05/13/24 2356 05/14/24 0804  NA 118* 118*   < > 125* 128* 125* 125* 124*  K 3.5 4.1  --   --  3.5  --   --   --   CL 80* 85*  --   --  100  --   --   --   CO2 26 25  --   --  21*  --   --   --   GLUCOSE 117* 93  --   --  86  --   --   --   BUN 15 9  --   --  7*  --   --   --   CREATININE 0.63 0.46  --   --  0.50  --   --   --   CALCIUM  9.1 8.2*  --   --  7.8*  --   --   --    < > = values in this interval not displayed.   GFR: Estimated Creatinine Clearance: 35.5 mL/min (by C-G formula based on SCr of 0.5 mg/dL).  Liver Function Tests: No results for input(s): "AST", "ALT", "ALKPHOS", "BILITOT", "PROT", "ALBUMIN" in the last 168 hours. No results for input(s): "LIPASE", "AMYLASE" in the last 168 hours. No results for input(s): "AMMONIA" in the last 168 hours.  CBC: Recent Labs  Lab 05/11/24 1058 05/12/24 0510 05/13/24 0715   WBC 12.0* 9.4 9.0  NEUTROABS 8.2*  --   --   HGB 15.1* 13.9 13.5  HCT 41.6 38.3 38.1  MCV 85.8 85.9 87.4  PLT 350 300 291     HbA1C: No results found for: "HGBA1C"  Urinalysis: No results for input(s): "COLORURINE", "LABSPEC", "PHURINE", "GLUCOSEU", "HGBUR", "BILIRUBINUR", "KETONESUR", "PROTEINUR", "UROBILINOGEN", "NITRITE", "LEUKOCYTESUR" in the last 72 hours.  Invalid input(s): "APPERANCEUR"    Imaging: No results found.   Medications:    promethazine  (PHENERGAN ) injection (IM or IVPB) Stopped (05/12/24 1345)    amLODipine   5 mg Oral Daily   aspirin   81 mg Oral Daily   atenolol   25 mg Oral BID   azithromycin   500 mg Oral Daily   benzonatate  100 mg Oral BID   calcium  carbonate  500 mg of elemental calcium  Oral BID WC   Chlorhexidine  Gluconate Cloth  6 each Topical Daily   cholecalciferol   1,000 Units Oral Daily  dextromethorphan -guaiFENesin   1 tablet Oral BID   docusate sodium   100 mg Oral Daily   enoxaparin  (LOVENOX ) injection  40 mg Subcutaneous QHS   feeding supplement  237 mL Oral BID BM   losartan   100 mg Oral Daily   magnesium  oxide  400 mg Oral Daily   multivitamin with minerals  1 tablet Oral Daily   pantoprazole   40 mg Oral BID AC   polyethylene glycol  17 g Oral Daily   sodium chloride   1 g Oral TID WC    Assessment/ Plan:   89 year old female with history of hypertension, coronary artery disease, congestive heart failure, gastroesophageal reflux disease now admitted with history of left lower lobe pneumonia. She was admitted with nausea, poor oral intake and weakness. On admission she has a sodium of 118. She was started on 3% saline.   #1: Hyponatremia: Hyponatremia is most likely secondary to SIADH secondary to pneumonia complicated by intake of hydrochlorothiazide as outpatient.  Hypertonic saline was discontinued and is presently on salt tablets.   Will continue fluid restriction to 1000 cc a day.   #2: Hypertension: Will continue atenolol ,  amlodipine  and losartan  at the present doses.   #3: Community-acquired pneumonia: Continue azithromycin  as ordered.  #4: Constipation: Patient has been on docusate and also MiraLAX .   Continue to follow closely.  Labs and medications reviewed. Will continue to follow along with you.   LOS: 3 Worthy Heads, MD [facility] 5/18/20251:53 PM

## 2024-05-14 NOTE — Plan of Care (Signed)
  Problem: Education: Goal: Knowledge of General Education information will improve Description: Including pain rating scale, medication(s)/side effects and non-pharmacologic comfort measures 05/14/2024 0114 by Rayleen Cal, RN Outcome: Progressing 05/14/2024 0114 by Rayleen Cal, RN Outcome: Progressing   Problem: Health Behavior/Discharge Planning: Goal: Ability to manage health-related needs will improve 05/14/2024 0114 by Rayleen Cal, RN Outcome: Progressing 05/14/2024 0114 by Rayleen Cal, RN Outcome: Progressing   Problem: Clinical Measurements: Goal: Ability to maintain clinical measurements within normal limits will improve 05/14/2024 0114 by Rayleen Cal, RN Outcome: Progressing 05/14/2024 0114 by Rayleen Cal, RN Outcome: Progressing Goal: Will remain free from infection 05/14/2024 0114 by Rayleen Cal, RN Outcome: Progressing 05/14/2024 0114 by Rayleen Cal, RN Outcome: Progressing Goal: Diagnostic test results will improve 05/14/2024 0114 by Rayleen Cal, RN Outcome: Progressing 05/14/2024 0114 by Rayleen Cal, RN Outcome: Progressing Goal: Respiratory complications will improve 05/14/2024 0114 by Rayleen Cal, RN Outcome: Progressing 05/14/2024 0114 by Rayleen Cal, RN Outcome: Progressing Goal: Cardiovascular complication will be avoided 05/14/2024 0114 by Rayleen Cal, RN Outcome: Progressing 05/14/2024 0114 by Rayleen Cal, RN Outcome: Progressing   Problem: Activity: Goal: Risk for activity intolerance will decrease 05/14/2024 0114 by Rayleen Cal, RN Outcome: Progressing 05/14/2024 0114 by Rayleen Cal, RN Outcome: Progressing   Problem: Nutrition: Goal: Adequate nutrition will be maintained 05/14/2024 0114 by Rayleen Cal, RN Outcome: Progressing 05/14/2024 0114 by Rayleen Cal, RN Outcome: Progressing   Problem: Coping: Goal: Level of anxiety will decrease 05/14/2024  0114 by Rayleen Cal, RN Outcome: Progressing 05/14/2024 0114 by Rayleen Cal, RN Outcome: Progressing   Problem: Elimination: Goal: Will not experience complications related to bowel motility 05/14/2024 0114 by Rayleen Cal, RN Outcome: Progressing 05/14/2024 0114 by Rayleen Cal, RN Outcome: Progressing Goal: Will not experience complications related to urinary retention 05/14/2024 0114 by Rayleen Cal, RN Outcome: Progressing 05/14/2024 0114 by Rayleen Cal, RN Outcome: Progressing   Problem: Pain Managment: Goal: General experience of comfort will improve and/or be controlled 05/14/2024 0114 by Rayleen Cal, RN Outcome: Progressing 05/14/2024 0114 by Rayleen Cal, RN Outcome: Progressing   Problem: Safety: Goal: Ability to remain free from injury will improve 05/14/2024 0114 by Rayleen Cal, RN Outcome: Progressing 05/14/2024 0114 by Rayleen Cal, RN Outcome: Progressing   Problem: Skin Integrity: Goal: Risk for impaired skin integrity will decrease 05/14/2024 0114 by Rayleen Cal, RN Outcome: Progressing 05/14/2024 0114 by Rayleen Cal, RN Outcome: Progressing

## 2024-05-14 NOTE — Progress Notes (Signed)
 Progress Note   Patient: Heather Miranda YSA:[id number] DOB: September 10, 1929 DOA: 05/11/2024     3 DOS: the patient was seen and examined on 05/14/2024   Brief hospital course: Ms. Heather Miranda is a 89 year old female with history of hypertension, GERD, who presents from [facility] for chief concerns of cough, congestion for 2 weeks.  Vitals in the ED showed T of 90, rr 23, hr 72, blood pressure 145/74, SpO2 of 94% on room air.  Serum sodium is 118, potassium 3.5, chloride 80, bicarb 26, BUN of 15, serum creatinine 2.63, EGFR greater than 60, nonfasting blood glucose 117, WBC 12, hemoglobin 15.1, platelets of 350. BNP was 99.  ED treatment: Azithromycin  500 mg IV one-time dose, ceftriaxone  IV one-time dose, sodium chloride  1 L bolus.  5/16: Vitals stable, sodium remained at 118 after getting 1 L of normal saline. Worsening weakness, hyponatremia labs ordered and transferring to stepdown to start on hypertonic saline.  Nephrology was consulted. Patient was on HCTZ at home which should not be continued on discharge.  5/17: Hemodynamically stable, sodium improved to 128.  Hypertonic saline was discontinued and she was started on normal saline at 40 mL/h. Expanded respiratory panel negative.  Pending PT and OT evaluation  5/18: Vital stable, sodium again started declining slowly, currently at 124.  Adding salt tablets.  Assessment and Plan: * Hyponatremia Acute on chronic hyponatremia.  Sodium improved to 128 today with hypertonic saline Was on HCTZ and seems like having slowly worsening chronic hyponatremia, HCTZ should not be continued at this time. Hyponatremia labs with hypotonic hyponatremia, normal urine osmolality and urinary sodium of 106. Initially received hypertonic saline which was discontinued once sodium reached 128, started trending down again -Adding salt tablet -Monitor sodium -Encourage to drink more water with electrolyte instead of just free water -Patient should not  be restarted on HCTZ  CAP (community acquired pneumonia) Patient with history of upper respiratory symptoms for the past couple of weeks, received 2 courses of antibiotics. Chest x-ray without any abnormality and procalcitonin negative Strep pneumo negative, MRSA PCR negative -Discontinue antibiotics -Supportive care  Benign essential hypertension Continue home amlodipine  5 mg daily, atenolol  25 mg p.o. twice daily, losartan  100 mg daily  Discontinuing home HCTZ for concern of progressively worsening chronic hyponatremia, HCTZ should not be resumed on discharge Hydralazine  5 mg IV every 6 hours as needed for SBP greater than 170, 5 days ordered  Type 2 diabetes mellitus without complication (HCC) Patient is current not on insulin and or oral antiglycemic agents CBG within goal -Continue to monitor  GERD (gastroesophageal reflux disease) - Continue home PPI   Subjective: Patient was seen and examined today.  No new concern.  We discussed about adding salt tablets.  Physical Exam: Vitals:   05/13/24 1607 05/13/24 2013 05/14/24 0520 05/14/24 0921  BP: (!) 143/61 (!) 133/56 (!) 135/56 (!) 156/64  Pulse: 83 73 (!) 56 64  Resp: 16 20 14    Temp: 98.1 F (36.7 C) 98.1 F (36.7 C) 97.9 F (36.6 C) (!) 97.5 F (36.4 C)  TempSrc:  Oral Oral Oral  SpO2: 95% 95%  99%  Weight:      Height:       General.  Frail elderly lady, in no acute distress. Pulmonary.  Lungs clear bilaterally, normal respiratory effort. CV.  Regular rate and rhythm, no JVD, rub or murmur. Abdomen.  Soft, nontender, nondistended, BS positive. CNS.  Alert and oriented .  No focal neurologic deficit. Extremities.  No edema, no cyanosis, pulses intact and symmetrical.  Data Reviewed: Prior data reviewed  Family Communication: Called daughter with no response  Disposition: Status is: Inpatient Remains inpatient appropriate because: Severity of illness  Planned Discharge Destination: Home with Home  Health  DVT prophylaxis.  Lovenox   Time spent: 50 minutes  This record has been created using Conservation officer, historic buildings. Errors have been sought and corrected,but may not always be located. Such creation errors do not reflect on the standard of care.   Author: Luna Salinas, MD 05/14/2024 1:23 PM  For on call review [email or web address].

## 2024-05-14 NOTE — Assessment & Plan Note (Signed)
 Patient is current not on insulin and or oral antiglycemic agents CBG within goal -Continue to monitor

## 2024-05-15 DIAGNOSIS — I10 Essential (primary) hypertension: Secondary | ICD-10-CM | POA: Diagnosis not present

## 2024-05-15 DIAGNOSIS — R338 Other retention of urine: Secondary | ICD-10-CM

## 2024-05-15 DIAGNOSIS — E119 Type 2 diabetes mellitus without complications: Secondary | ICD-10-CM | POA: Diagnosis not present

## 2024-05-15 DIAGNOSIS — K219 Gastro-esophageal reflux disease without esophagitis: Secondary | ICD-10-CM | POA: Diagnosis not present

## 2024-05-15 DIAGNOSIS — E878 Other disorders of electrolyte and fluid balance, not elsewhere classified: Secondary | ICD-10-CM

## 2024-05-15 DIAGNOSIS — E871 Hypo-osmolality and hyponatremia: Secondary | ICD-10-CM | POA: Diagnosis not present

## 2024-05-15 LAB — HEPATIC FUNCTION PANEL
ALT: 22 U/L (ref 0–44)
AST: 30 U/L (ref 15–41)
Albumin: 3.1 g/dL — ABNORMAL LOW (ref 3.5–5.0)
Alkaline Phosphatase: 76 U/L (ref 38–126)
Bilirubin, Direct: 0.1 mg/dL (ref 0.0–0.2)
Indirect Bilirubin: 0.6 mg/dL (ref 0.3–0.9)
Total Bilirubin: 0.7 mg/dL (ref 0.0–1.2)
Total Protein: 5.6 g/dL — ABNORMAL LOW (ref 6.5–8.1)

## 2024-05-15 LAB — CBC
HCT: 39.5 % (ref 36.0–46.0)
Hemoglobin: 13.8 g/dL (ref 12.0–15.0)
MCH: 30.7 pg (ref 26.0–34.0)
MCHC: 34.9 g/dL (ref 30.0–36.0)
MCV: 88 fL (ref 80.0–100.0)
Platelets: 250 10*3/uL (ref 150–400)
RBC: 4.49 MIL/uL (ref 3.87–5.11)
RDW: 13.1 % (ref 11.5–15.5)
WBC: 9.7 10*3/uL (ref 4.0–10.5)
nRBC: 0 % (ref 0.0–0.2)

## 2024-05-15 LAB — BASIC METABOLIC PANEL WITH GFR
Anion gap: 9 (ref 5–15)
Anion gap: 10 (ref 5–15)
BUN: 14 mg/dL (ref 8–23)
BUN: 16 mg/dL (ref 8–23)
CO2: 23 mmol/L (ref 22–32)
CO2: 23 mmol/L (ref 22–32)
Calcium: 8.1 mg/dL — ABNORMAL LOW (ref 8.9–10.3)
Calcium: 8.2 mg/dL — ABNORMAL LOW (ref 8.9–10.3)
Chloride: 92 mmol/L — ABNORMAL LOW (ref 98–111)
Chloride: 89 mmol/L — ABNORMAL LOW (ref 98–111)
Creatinine, Ser: 0.41 mg/dL — ABNORMAL LOW (ref 0.44–1.00)
Creatinine, Ser: 0.49 mg/dL (ref 0.44–1.00)
GFR, Estimated: >60 mL/min (ref >60)
GFR, Estimated: >60 mL/min (ref >60)
Glucose, Bld: 103 mg/dL — ABNORMAL HIGH (ref 70–99)
Glucose, Bld: 150 mg/dL — ABNORMAL HIGH (ref 70–99)
Potassium: 3.4 mmol/L — ABNORMAL LOW (ref 3.5–5.1)
Potassium: 4.1 mmol/L (ref 3.5–5.1)
Sodium: 124 mmol/L — ABNORMAL LOW (ref 135–145)
Sodium: 122 mmol/L — ABNORMAL LOW (ref 135–145)

## 2024-05-15 LAB — PHOSPHORUS: Phosphorus: 2.3 mg/dL — ABNORMAL LOW (ref 2.5–4.6)

## 2024-05-15 LAB — SODIUM
Sodium: 122 mmol/L — ABNORMAL LOW (ref 135–145)
Sodium: 124 mmol/L — ABNORMAL LOW (ref 135–145)
Sodium: 128 mmol/L — ABNORMAL LOW (ref 135–145)

## 2024-05-15 LAB — MAGNESIUM: Magnesium: 1.9 mg/dL (ref 1.7–2.4)

## 2024-05-15 MED ORDER — TOLVAPTAN 15 MG PO TABS
15.0000 mg | ORAL_TABLET | Freq: Once | ORAL | Status: AC
  Administered 2024-05-15: 15 mg via ORAL
  Filled 2024-05-15: qty 1

## 2024-05-15 MED ORDER — SODIUM PHOSPHATES 45 MMOLE/15ML IV SOLN
15.0000 mmol | Freq: Once | INTRAVENOUS | Status: AC
  Administered 2024-05-15: 15 mmol via INTRAVENOUS
  Filled 2024-05-15: qty 5

## 2024-05-15 MED ORDER — POTASSIUM CHLORIDE CRYS ER 20 MEQ PO TBCR
40.0000 meq | EXTENDED_RELEASE_TABLET | Freq: Once | ORAL | Status: AC
  Administered 2024-05-15: 40 meq via ORAL
  Filled 2024-05-15: qty 2

## 2024-05-15 NOTE — Assessment & Plan Note (Addendum)
Resolved with repletion. -Continue to monitor and replete as needed

## 2024-05-15 NOTE — Progress Notes (Signed)
 Progress Note   Patient: Heather Miranda ZOX:[id number] DOB: 12-Jul-1929 DOA: 05/11/2024     4 DOS: the patient was seen and examined on 05/15/2024   Brief hospital course: Ms. Heather Miranda is a 89 year old female with history of hypertension, GERD, who presents from [facility] for chief concerns of cough, congestion for 2 weeks.  Vitals in the ED showed T of 90, rr 23, hr 72, blood pressure 145/74, SpO2 of 94% on room air.  Serum sodium is 118, potassium 3.5, chloride 80, bicarb 26, BUN of 15, serum creatinine 2.63, EGFR greater than 60, nonfasting blood glucose 117, WBC 12, hemoglobin 15.1, platelets of 350. BNP was 99.  ED treatment: Azithromycin  500 mg IV one-time dose, ceftriaxone  IV one-time dose, sodium chloride  1 L bolus.  5/16: Vitals stable, sodium remained at 118 after getting 1 L of normal saline. Worsening weakness, hyponatremia labs ordered and transferring to stepdown to start on hypertonic saline.  Nephrology was consulted. Patient was on HCTZ at home which should not be continued on discharge.  5/17: Hemodynamically stable, sodium improved to 128.  Hypertonic saline was discontinued and she was started on normal saline at 40 mL/h. Expanded respiratory panel negative.  Pending PT and OT evaluation  5/18: Vital stable, sodium again started declining slowly, currently at 124.  Adding salt tablets.  5/19: Hemodynamically stable but sodium slowly decreasing despite taking salt tablets.  Nephrology is giving 1 dose of tolvaptan  15 mg.  Mild hypophosphatemia and hypokalemia which are being repleted. Foley catheter to be removed today to give her a voiding trial. PT and OT are recommending SNF.  Assessment and Plan: * Hyponatremia Acute on chronic hyponatremia.  Sodium improved to 128 today with hypertonic saline Was on HCTZ and seems like having slowly worsening chronic hyponatremia, HCTZ should not be continued at this time. Hyponatremia labs with hypotonic  hyponatremia, normal urine osmolality and urinary sodium of 106. Initially received hypertonic saline which was discontinued once sodium reached 128, started trending down again -Nephrology ordered 1 dose of tolvaptan  - Continue salt tablet -Monitor sodium -Encourage to drink more water with electrolyte instead of just free water -Patient should not be restarted on HCTZ  CAP (community acquired pneumonia) Patient with history of upper respiratory symptoms for the past couple of weeks, received 2 courses of antibiotics. Chest x-ray without any abnormality and procalcitonin negative Strep pneumo negative, MRSA PCR negative -Discontinue antibiotics -Supportive care  Benign essential hypertension Continue home amlodipine  5 mg daily, atenolol  25 mg p.o. twice daily, losartan  100 mg daily  Discontinuing home HCTZ for concern of progressively worsening chronic hyponatremia, HCTZ should not be resumed on discharge Hydralazine  5 mg IV every 6 hours as needed for SBP greater than 170, 5 days ordered  Type 2 diabetes mellitus without complication (HCC) Patient is current not on insulin and or oral antiglycemic agents CBG within goal -Continue to monitor  GERD (gastroesophageal reflux disease) - Continue home PPI  Acute urinary retention Foley catheter was placed in ICU when patient developed acute urinary retention requiring In-N-Out catheter twice. No prior history of urinary difficulty. - Remove Foley catheter to give her a voiding trial   Electrolyte abnormality Patient was found to have mild hypokalemia and hypophosphatemia. - Replete electrolytes and monitor   Subjective: Patient was sitting in chair when seen today.  She was feeling weak but no other concern.  Physical Exam: Vitals:   05/14/24 0921 05/14/24 1709 05/14/24 2056 05/15/24 0419  BP: (!) 156/64 135/64 [name]  140/58 (!) 176/64  Pulse: 64 63 77 (!) 59  Resp:  16 18 18   Temp: (!) 97.5 F (36.4 C) 97.6 F (36.4 C) 99.4  F (37.4 C) 99.4 F (37.4 C)  TempSrc: Oral Oral Oral Oral  SpO2: 99% 94% 96% 98%  Weight:      Height:       General.  Frail elderly lady, in no acute distress. Pulmonary.  Lungs clear bilaterally, normal respiratory effort. CV.  Regular rate and rhythm, no JVD, rub or murmur. Abdomen.  Soft, nontender, nondistended, BS positive. CNS.  Alert and oriented .  No focal neurologic deficit. Extremities.  No edema, no cyanosis, pulses intact and symmetrical.   Data Reviewed: Prior data reviewed  Family Communication: Talked with son  Disposition: Status is: Inpatient Remains inpatient appropriate because: Severity of illness  Planned Discharge Destination: Home with Home Health  DVT prophylaxis.  Lovenox   Time spent: 50 minutes  This record has been created using Conservation officer, historic buildings. Errors have been sought and corrected,but may not always be located. Such creation errors do not reflect on the standard of care.   Author: Luna Salinas, MD 05/15/2024 1:43 PM  For on call review [email or web address].

## 2024-05-15 NOTE — Plan of Care (Signed)

## 2024-05-15 NOTE — TOC CM/SW Note (Signed)
 CSW went by room to discuss SNF recommendation but patient was using [facility]. Will try again later.  Duke Gibbons, CSW [phone]

## 2024-05-15 NOTE — Evaluation (Signed)
 Occupational Therapy Evaluation Patient Details Name: Heather Miranda MRN: [id number] DOB: [birth year] Today's Date: 05/15/2024   History of Present Illness   Pt is a 89 year old female with history of hypertension, GERD, who presents from [facility] [place] for chief concerns of cough, congestion for 2 weeks. MD assessement includes: acute on chronic hyponatremia and CAP.     Clinical Impressions Pt was seen for OT evaluation this date. PTA, pt resides at [facility] in an apartment. Pt reports using a rollator for mobility at MOD I level with ability to walk throughout facility, to dining room, etc. Uses a SPC for community distances in the grocery store, etc., via transportation from her ILF. She is IND with ADL performance. Pt presents to acute OT demonstrating impaired ADL performance and functional mobility 2/2 weakness and low activity tolerance.  Pt currently requires Min/CGA for STS from recliner, cueing for hand placement and safety with descent to recliner. Supervision for donning/doffing socks via figure four while seated in recliner. Supervision for UB ADLs seated in recliner. Pt ambulated ~4-5 steps forward/backwards using RW with CGA.  Pt would benefit from skilled OT services to address noted impairments and functional limitations to maximize safety and independence while minimizing falls risk and caregiver burden. Do anticipate the need for follow up OT services upon acute hospital DC.      If plan is discharge home, recommend the following:   A little help with walking and/or transfers;A lot of help with bathing/dressing/bathroom;Assistance with cooking/housework;Assist for transportation;Help with stairs or ramp for entrance     Functional Status Assessment   Patient has had a recent decline in their functional status and demonstrates the ability to make significant improvements in function in a reasonable and predictable amount of time.     Equipment  Recommendations   Other (comment) (defer)     Recommendations for Other Services         Precautions/Restrictions   Precautions Precautions: Fall Restrictions Weight Bearing Restrictions Per Provider Order: No Other Position/Activity Restrictions: Seizure precautions but ok to recliner per Dr. Ariel Begun     Mobility Bed Mobility               General bed mobility comments: deferred in recliner on entry    Transfers Overall transfer level: Needs assistance Equipment used: Rolling walker (2 wheels) Transfers: Sit to/from Stand Sit to Stand: Contact guard assist, Min assist           General transfer comment: verb cues from recliner and for descent; tolerated ~4-5 steps forwards and backwards using RW with CGA      Balance Overall balance assessment: Needs assistance Sitting-balance support: Feet supported Sitting balance-Leahy Scale: Normal     Standing balance support: Bilateral upper extremity supported, During functional activity Standing balance-Leahy Scale: Good Standing balance comment: RW use, CGA                           ADL either performed or assessed with clinical judgement   ADL Overall ADL's : Needs assistance/impaired     Grooming: Wash/dry face;Applying deodorant;Sitting;Set up   Upper Body Bathing: Supervision/ safety;Set up;Sitting Upper Body Bathing Details (indicate cue type and reason): increased time, bathed under armpits     Upper Body Dressing : Supervision/safety;Sitting Upper Body Dressing Details (indicate cue type and reason): to doff/don clean gown Lower Body Dressing: Supervision/safety;Sitting/lateral leans Lower Body Dressing Details (indicate cue type and reason): to don/doff socks  seated via figure four Toilet Transfer: Rolling walker (2 wheels);Minimal assistance Toilet Transfer Details (indicate cue type and reason): simulated to recliner with cueing for safe descent         Functional mobility  during ADLs: Contact guard assist;Rolling walker (2 wheels)       Vision         Perception         Praxis         Pertinent Vitals/Pain Pain Assessment Pain Assessment: No/denies pain     Extremity/Trunk Assessment Upper Extremity Assessment Upper Extremity Assessment: Generalized weakness   Lower Extremity Assessment Lower Extremity Assessment: Generalized weakness       Communication Communication Communication: No apparent difficulties   Cognition Arousal: Alert Behavior During Therapy: WFL for tasks assessed/performed                                 Following commands: Intact       Cueing  General Comments   Cueing Techniques: Verbal cues  pt fatigues easily although VSS   Exercises Other Exercises Other Exercises: Edu on role of OT in acute setting, PLB, pacing, and other ECS during ADL performance   Shoulder Instructions      Home Living Family/patient expects to be discharged to:: Private residence Living Arrangements: Alone   Type of Home: Independent living facility Home Access: Level entry     Home Layout: One level     Bathroom Shower/Tub: Chief Strategy Officer: Handicapped height     Home Equipment: Shower seat;Grab bars - toilet;Grab bars - tub/shower;Rollator (4 wheels);Cane - single point   Additional Comments: Pt is a resident at [employer] at [facility] in an Ind living apt      Prior Functioning/Environment Prior Level of Function : Independent/Modified Independent             Mobility Comments: Mod Ind amb with a rollator facility distances, SPC in the community with pt able to amb in grocery stores and [employer] ADLs Comments: Ind with ADLs, [facility] staff drives residents for errands and apts    OT Problem List: Decreased strength;Decreased activity tolerance   OT Treatment/Interventions: Self-care/ADL training;Balance training;Therapeutic exercise;Therapeutic activities;Energy  conservation;Patient/family education      OT Goals(Current goals can be found in the care plan section)   Acute Rehab OT Goals Patient Stated Goal: improve endurance OT Goal Formulation: With patient Time For Goal Achievement: 05/29/24 Potential to Achieve Goals: Fair ADL Goals Pt Will Perform Lower Body Bathing: sitting/lateral leans;with adaptive equipment;with supervision;sit to/from stand Pt Will Perform Lower Body Dressing: with contact guard assist;sit to/from stand;sitting/lateral leans Pt Will Transfer to Toilet: with supervision;ambulating;regular height toilet;bedside commode;grab bars Pt Will Perform Toileting - Clothing Manipulation and hygiene: with supervision;sitting/lateral leans;sit to/from stand Additional ADL Goal #1: Pt will demo use of 1 learned ECS during ADL performance 2/2 trials to maximize safety, IND and prevent overexertion.   OT Frequency:  Min 2X/week    Co-evaluation              AM-PAC OT "6 Clicks" Daily Activity     Outcome Measure Help from another person eating meals?: None Help from another person taking care of personal grooming?: None Help from another person toileting, which includes using toliet, bedpan, or urinal?: A Little Help from another person bathing (including washing, rinsing, drying)?: A Lot Help from another person to put on and taking  off regular upper body clothing?: A Little Help from another person to put on and taking off regular lower body clothing?: A Lot 6 Click Score: 18   End of Session Equipment Utilized During Treatment: Rolling walker (2 wheels);Gait belt Nurse Communication: Mobility status  Activity Tolerance: Patient tolerated treatment well Patient left: in chair;with call bell/phone within reach;with chair alarm set  OT Visit Diagnosis: Other abnormalities of gait and mobility (R26.89);Muscle weakness (generalized) (M62.81)                Time: 1610-9604 OT Time Calculation (min): 18 min Charges:   OT General Charges $OT Visit: 1 Visit OT Evaluation $OT Eval Moderate Complexity: 1 Mod  Tatumn Corbridge, OTR/L 05/15/24, 1:23 PM  Treniece Holsclaw E Demetrios Byron 05/15/2024, 1:20 PM

## 2024-05-15 NOTE — Assessment & Plan Note (Addendum)
 Resolved.  Able to void well after removal of Foley catheter

## 2024-05-15 NOTE — TOC Initial Note (Signed)
 Transition of Care [facility]) - Initial/Assessment Note    Patient Details  Name: Heather Miranda MRN: [id number] Date of Birth: [birth year]  [facility] CM/SW Contact:    Odilia Bennett, LCSW Phone Number: 05/15/2024, 2:57 PM  Clinical Narrative:  CSW met with patient. No family at bedside. CSW introduced role and explained that therapy recommendations would be discussed. Patient is agreeable to going to the SNF side at [facility] Per PT note, she can safely transport by private vehicle. Patient is aware and stated that her son should be able to transport. CSW called son Jonelle Neri who confirmed but he does not want her discharging until her cough is addressed. MD is aware and stated she has had the cough for the past couple of weeks since she had pneumonia which has been treated. MD discussed this with son yesterday. Per MD, likely discharge by Wednesday if sodium improved. CSW started insurance authorization and updated Pam at [facility]. No further concerns. CSW will continue to follow patient and her son for support and facilitate discharge to SNF once medically stable.              Expected Discharge Plan: Skilled Nursing Facility Barriers to Discharge: Continued Medical Work up   Patient Goals and CMS Choice            Expected Discharge Plan and Services     Post Acute Care Choice: Skilled Nursing Facility Living arrangements for the past 2 months: Independent Living Facility                                      Prior Living Arrangements/Services Living arrangements for the past 2 months: Independent Living Facility Lives with:: Self Patient language and need for interpreter reviewed:: Yes Do you feel safe going back to the place where you live?: Yes      Need for Family Participation in Patient Care: Yes (Comment)     Criminal Activity/Legal Involvement Pertinent to Current Situation/Hospitalization: No - Comment as  needed  Activities of Daily Living   ADL Screening (condition at time of admission) Independently performs ADLs?: Yes (appropriate for developmental age) Is the patient deaf or have difficulty hearing?: No Does the patient have difficulty seeing, even when wearing glasses/contacts?: No Does the patient have difficulty concentrating, remembering, or making decisions?: Yes  Permission Sought/Granted Permission sought to share information with : Facility Medical sales representative, Family Supports Permission granted to share information with : Yes, Verbal Permission Granted  Share Information with NAME: Torsha Lemus  Permission granted to share info w AGENCY: [facility]  Permission granted to share info w Relationship: Son  Permission granted to share info w Contact Information: [phone]  Emotional Assessment Appearance:: Appears stated age Attitude/Demeanor/Rapport: Engaged, Gracious Affect (typically observed): Accepting, Appropriate, Calm, Pleasant Orientation: : Oriented to Self, Oriented to Place, Oriented to  Time, Oriented to Situation Alcohol / Substance Use: Not Applicable Psych Involvement: No (comment)  Admission diagnosis:  Hyponatremia [E87.1] Community acquired pneumonia of left lower lobe of lung [J18.9] Patient Active Problem List   Diagnosis Date Noted   Acute urinary retention 05/15/2024   Electrolyte abnormality 05/15/2024   Hyponatremia 05/11/2024   CAP (community acquired pneumonia) 05/11/2024   Anemia, unspecified 11/14/2019   Benign essential hypertension 11/14/2019   DOE (dyspnea on exertion) 11/14/2019   Hiatal hernia 11/14/2019   Menopausal state  11/14/2019   Osteoarthritis 11/14/2019   Psoriasis 11/14/2019   Pure hypercholesterolemia 11/14/2019   Type 2 diabetes mellitus without complication (HCC) 11/14/2019   Ventricular ectopy 11/14/2019   Pain in limb 11/14/2019   GERD (gastroesophageal reflux disease) 05/10/2018   Drug-induced  constipation 09/30/2017   Spinal stenosis of lumbar region 09/30/2017   Lumbar radiculopathy 09/29/2017   Low back pain 05/07/2017   Carotid stenosis 05/07/2017   Nonrheumatic aortic valve insufficiency 10/06/2016   Chest pain with high risk for cardiac etiology 03/23/2016   Generalized osteoarthritis of hand 02/10/2016   Musculoskeletal neck pain 02/10/2016   Spondylosis of cervical region without myelopathy or radiculopathy 02/10/2016   Status post total right knee replacement 10/14/2015   Hyperlipidemia, unspecified 05/22/2014   Osteoporosis 05/22/2014   PCP:  Yehuda Helms, MD Pharmacy:   [facility] - [place], [place] - [address] [place] [address] Phone: [phone] Fax: [phone]     Social Drivers of Health (SDOH) Social History: SDOH Screenings   Food Insecurity: No Food Insecurity (05/11/2024)  Housing: Low Risk  (05/11/2024)  Transportation Needs: No Transportation Needs (05/11/2024)  Utilities: Not At Risk (05/11/2024)  Social Connections: Moderately Integrated (05/11/2024)  Tobacco Use: Low Risk  (05/11/2024)   SDOH Interventions:     Readmission Risk Interventions     No data to display

## 2024-05-15 NOTE — [place] FL2 (Signed)
 [place]  MEDICAID FL2 LEVEL OF CARE FORM     IDENTIFICATION  Patient Name: Heather Miranda Birthdate: 1929/04/17 Sex: female Admission Date (Current Location): 05/11/2024  [facility] and [place] Number:  Chiropodist and Address:  [facility], [address] [place], [place] [address]      Provider Number: [id number]  Attending Physician Name and Address:  Luna Salinas, MD  Relative Name and Phone Number:       Current Level of Care: Hospital Recommended Level of Care: Skilled Nursing Facility Prior Approval Number:    Date Approved/Denied:   PASRR Number: [id number]  Discharge Plan: SNF    Current Diagnoses: Patient Active Problem List   Diagnosis Date Noted   Acute urinary retention 05/15/2024   Electrolyte abnormality 05/15/2024   Hyponatremia 05/11/2024   CAP (community acquired pneumonia) 05/11/2024   Anemia, unspecified 11/14/2019   Benign essential hypertension 11/14/2019   DOE (dyspnea on exertion) 11/14/2019   Hiatal hernia 11/14/2019   Menopausal state 11/14/2019   Osteoarthritis 11/14/2019   Psoriasis 11/14/2019   Pure hypercholesterolemia 11/14/2019   Type 2 diabetes mellitus without complication (HCC) 11/14/2019   Ventricular ectopy 11/14/2019   Pain in limb 11/14/2019   GERD (gastroesophageal reflux disease) 05/10/2018   Drug-induced constipation 09/30/2017   Spinal stenosis of lumbar region 09/30/2017   Lumbar radiculopathy 09/29/2017   Low back pain 05/07/2017   Carotid stenosis 05/07/2017   Nonrheumatic aortic valve insufficiency 10/06/2016   Chest pain with high risk for cardiac etiology 03/23/2016   Generalized osteoarthritis of hand 02/10/2016   Musculoskeletal neck pain 02/10/2016   Spondylosis of cervical region without myelopathy or radiculopathy 02/10/2016   Status post total right knee replacement 10/14/2015   Hyperlipidemia, unspecified 05/22/2014   Osteoporosis 05/22/2014    Orientation  RESPIRATION BLADDER Height & Weight     Self, Time, Situation, Place  Normal Incontinent, Indwelling catheter Weight: 131 lb 9.8 oz (59.7 kg) Height:  5' (152.4 cm)  BEHAVIORAL SYMPTOMS/MOOD NEUROLOGICAL BOWEL NUTRITION STATUS   (None)  (None) Continent Diet (Regular)  AMBULATORY STATUS COMMUNICATION OF NEEDS Skin   Limited Assist Verbally Normal                       Personal Care Assistance Level of Assistance  Bathing, Feeding, Dressing Bathing Assistance: Limited assistance (Supervision) Feeding assistance: Limited assistance (Supervision) Dressing Assistance: Limited assistance (Supervision)     Functional Limitations Info  Sight, Hearing, Speech Sight Info: Adequate Hearing Info: Adequate Speech Info: Adequate    SPECIAL CARE FACTORS FREQUENCY  PT (By licensed PT), OT (By licensed OT)     PT Frequency: 5 x week OT Frequency: 5 x week            Contractures Contractures Info: Not present    Additional Factors Info  Code Status, Allergies Code Status Info: DNR Allergies Info: Morphine And Codeine, Oxycontin (Oxycodone), Percocet (Oxycodone-acetaminophen ), Morphine, Oxycodone-acetaminophen            Current Medications (05/15/2024):  This is the current hospital active medication list Current Facility-Administered Medications  Medication Dose Route Frequency Provider Last Rate Last Admin   acetaminophen  (TYLENOL ) tablet 650 mg  650 mg Oral Q6H PRN Cox, Amy N, DO   650 mg at 05/12/24 0818   Or   acetaminophen  (TYLENOL ) suppository 650 mg  650 mg Rectal Q6H PRN Cox, Amy N, DO       amLODipine  (NORVASC ) tablet 5 mg  5 mg Oral  Daily Cox, Amy N, DO   5 mg at 05/15/24 0909   aspirin  chewable tablet 81 mg  81 mg Oral Daily Cox, Amy N, DO   81 mg at 05/15/24 0908   atenolol  (TENORMIN ) tablet 25 mg  25 mg Oral BID Cox, Amy N, DO   25 mg at 05/15/24 0981   benzonatate  (TESSALON ) capsule 100 mg  100 mg Oral BID Amin, Sumayya, MD   100 mg at 05/15/24 0909   calcium   carbonate (OS-CAL - dosed in mg of elemental calcium ) tablet 1,250 mg  500 mg of elemental calcium  Oral BID WC Cox, Amy N, DO   1,250 mg at 05/15/24 0909   Chlorhexidine  Gluconate Cloth 2 % PADS 6 each  6 each Topical Daily Amin, Sumayya, MD   6 each at 05/15/24 0917   cholecalciferol  (VITAMIN D3) 25 MCG (1000 UNIT) tablet 1,000 Units  1,000 Units Oral Daily Cox, Amy N, DO   1,000 Units at 05/15/24 0909   dextromethorphan -guaiFENesin  (MUCINEX  DM) 30-600 MG per 12 hr tablet 1 tablet  1 tablet Oral BID Amin, Sumayya, MD   1 tablet at 05/15/24 0908   docusate sodium  (COLACE) capsule 100 mg  100 mg Oral Daily Anola King, NP   100 mg at 05/15/24 0909   enoxaparin  (LOVENOX ) injection 40 mg  40 mg Subcutaneous QHS Cox, Amy N, DO   40 mg at 05/14/24 2123   feeding supplement (ENSURE ENLIVE / ENSURE PLUS) liquid 237 mL  237 mL Oral BID BM Anola King, NP   237 mL at 05/15/24 1336   hydrALAZINE  (APRESOLINE ) injection 5 mg  5 mg Intravenous Q6H PRN Cox, Amy N, DO       lactulose  (CHRONULAC ) 10 GM/15ML solution 20 g  20 g Oral BID PRN Amin, Sumayya, MD   20 g at 05/14/24 1708   losartan  (COZAAR ) tablet 100 mg  100 mg Oral Daily Cox, Amy N, DO   100 mg at 05/15/24 0909   magnesium  oxide (MAG-OX) tablet 400 mg  400 mg Oral Daily Cox, Amy N, DO   400 mg at 05/15/24 0909   multivitamin with minerals tablet 1 tablet  1 tablet Oral Daily Cox, Amy N, DO   1 tablet at 05/15/24 0907   ondansetron  (ZOFRAN ) tablet 4 mg  4 mg Oral Q6H PRN Cox, Amy N, DO       Or   ondansetron  (ZOFRAN ) injection 4 mg  4 mg Intravenous Q6H PRN Cox, Amy N, DO   4 mg at 05/12/24 1010   Oral care mouth rinse  15 mL Mouth Rinse PRN Luna Salinas, MD       pantoprazole  (PROTONIX ) EC tablet 40 mg  40 mg Oral BID AC Cox, Amy N, DO   40 mg at 05/15/24 0909   polyethylene glycol (MIRALAX  / GLYCOLAX ) packet 17 g  17 g Oral Daily Amin, Sumayya, MD   17 g at 05/15/24 0907   promethazine  (PHENERGAN ) 12.5 mg in sodium chloride  0.9 % 50 mL  IVPB  12.5 mg Intravenous Q6H PRN Amin, Sumayya, MD   Stopped at 05/12/24 1345   sodium chloride  tablet 1 g  1 g Oral TID WC Amin, Sumayya, MD   1 g at 05/15/24 1335   sodium phosphate  15 mmol in sodium chloride  0.9 % 250 mL infusion  15 mmol Intravenous Once Amin, Sumayya, MD 43 mL/hr at 05/15/24 1407 15 mmol at 05/15/24 1407     Discharge Medications: Please see discharge summary  for a list of discharge medications.  Relevant Imaging Results:  Relevant Lab Results:   Additional Information SS#: [id number]  Odilia Bennett, LCSW

## 2024-05-15 NOTE — Progress Notes (Signed)
 MEDICATION RELATED CONSULT NOTE  Pharmacy Consult for Tolvaptan  Indication: hyponatremia  Allergies  Allergen Reactions   Morphine And Codeine Nausea Only   Oxycontin [Oxycodone] Nausea Only   Percocet [Oxycodone-Acetaminophen ] Nausea Only   Morphine Nausea Only   Oxycodone-Acetaminophen  Rash    Patient Measurements: Height: 5' (152.4 cm) Weight: 59.7 kg (131 lb 9.8 oz) IBW/kg (Calculated) : 45.5   Vital Signs: Temp: 99.4 F (37.4 C) (05/19 0419) Temp Source: Oral (05/19 0419) BP: 176/64 (05/19 0419) Pulse Rate: 59 (05/19 0419) Intake/Output from previous day: 05/18 0701 - 05/19 0700 In: 580 [P.O.:580] Out: 1225 [Urine:1225] Intake/Output from this shift: Total I/O In: 240 [P.O.:240] Out: -   Labs: Recent Labs    05/13/24 0715 05/15/24 0543 05/15/24 0544  WBC 9.0  --  9.7  HGB 13.5  --  13.8  HCT 38.1  --  39.5  PLT 291  --  250  CREATININE 0.50  --  0.41*  MG  --  1.9  --   PHOS  --  2.3*  --    Estimated Creatinine Clearance: 35.5 mL/min (A) (by C-G formula based on SCr of 0.41 mg/dL (L)).   Medical History: Past Medical History:  Diagnosis Date   Arthritis    Dysrhythmia    Environmental and seasonal allergies    GERD (gastroesophageal reflux disease)    Hernia, hiatal    Hx of dysplastic nevus 09/13/2015   L mid back paraspinal, moderate atypia    Assessment: 89 yo female presented to the ED with primary complaint of weakness. Found to be hyponatremic with Na = 118.  Patient was previously on NaCl 3% infusion from 5/16-5/17.  Most recent Na = 122.  Patient currently on NaCl 1 g po TID.  Nephrology has ordered Tolvaptan  15 mg po x 1.  5/19 0751 Na 122  Goal of Therapy:  Desired rate of correction: should not exceed 8 mEq/L per 8 hours or 12 mEq/L in 24 hours.   Plan:  Tolvaptan  15 mg po x 1 ordered by nephrology Monitor Na level every 8 hours for the first 24 hours If too rapid a correction is noted please contact provider  Ramonita Burow, PharmD 05/15/2024,12:58 PM

## 2024-05-15 NOTE — Evaluation (Signed)
 Physical Therapy Evaluation Patient Details Name: Heather Miranda MRN: [id number] DOB: [birth year] Today's Date: 05/15/2024  History of Present Illness  Pt is a 89 year old female with history of hypertension, GERD, who presents from [facility] [place] for chief concerns of cough, congestion for 2 weeks. MD assessement includes: acute on chronic hyponatremia and CAP.  Clinical Impression  Pt was pleasant and motivated to participate during the session and put forth good effort throughout. Pt required extra time and effort along with cuing for sequencing during the session but required no physical assistance with functional tasks.  Pt is a community ambulator at baseline but was only able to amb a self-selected max of 8 feet before becoming SOB and needing to return to sitting with SpO2 and HR WNL on room air.  Pt will benefit from continued PT services upon discharge to safely address deficits listed in patient problem list for decreased caregiver assistance and eventual return to PLOF.          If plan is discharge home, recommend the following: A little help with walking and/or transfers;A little help with bathing/dressing/bathroom;Assistance with cooking/housework;Assist for transportation   Can travel by private vehicle   Yes    Equipment Recommendations Other (comment) (TBD at next venue of care)  Recommendations for Other Services       Functional Status Assessment Patient has had a recent decline in their functional status and demonstrates the ability to make significant improvements in function in a reasonable and predictable amount of time.     Precautions / Restrictions Precautions Precautions: Fall Restrictions Weight Bearing Restrictions Per Provider Order: No Other Position/Activity Restrictions: Seizure precautions but ok to recliner per Dr. Ariel Begun      Mobility  Bed Mobility Overal bed mobility: Modified Independent             General bed mobility comments:  Min extra time and effort    Transfers Overall transfer level: Needs assistance Equipment used: Rolling walker (2 wheels) Transfers: Sit to/from Stand Sit to Stand: Contact guard assist, From elevated surface           General transfer comment: Min verbal cues for hand placement    Ambulation/Gait Ambulation/Gait assistance: Contact guard assist Gait Distance (Feet): 8 Feet Assistive device: Rolling walker (2 wheels) Gait Pattern/deviations: Step-through pattern, Decreased step length - right, Decreased step length - left, Trunk flexed Gait velocity: decreased     General Gait Details: Pt able to amb a max of 8 feet before fatiguing and needing to return to sitting reporting heavy SOB but with SpO2 and HR WNL  Stairs            Wheelchair Mobility     Tilt Bed    Modified Rankin (Stroke Patients Only)       Balance Overall balance assessment: Needs assistance   Sitting balance-Leahy Scale: Normal     Standing balance support: Bilateral upper extremity supported, During functional activity Standing balance-Leahy Scale: Good                               Pertinent Vitals/Pain Pain Assessment Pain Assessment: No/denies pain    Home Living Family/patient expects to be discharged to:: Private residence Living Arrangements: Alone   Type of Home: Independent living facility Home Access: Level entry       Home Layout: One level Home Equipment: Shower seat;Grab bars - toilet;Grab bars - tub/shower;Rollator (4 wheels);Cane - single  point Additional Comments: Pt is a resident at [employer] in an Ind living apt    Prior Function Prior Level of Function : Independent/Modified Independent             Mobility Comments: Mod Ind amb with a rollator facility distances, SPC in the community with pt able to amb in grocery stores and [employer] ADLs Comments: Ind with ADLs, [facility] staff drives residents for errands and apts      Extremity/Trunk Assessment   Upper Extremity Assessment Upper Extremity Assessment: Generalized weakness    Lower Extremity Assessment Lower Extremity Assessment: Generalized weakness       Communication   Communication Communication: No apparent difficulties    Cognition Arousal: Alert Behavior During Therapy: WFL for tasks assessed/performed   PT - Cognitive impairments: No apparent impairments                         Following commands: Intact       Cueing Cueing Techniques: Verbal cues     General Comments      Exercises     Assessment/Plan    PT Assessment Patient needs continued PT services  PT Problem List Decreased strength;Decreased activity tolerance;Decreased balance;Decreased mobility;Decreased knowledge of use of DME       PT Treatment Interventions DME instruction;Gait training;Functional mobility training;Therapeutic activities;Therapeutic exercise;Balance training;Patient/family education    PT Goals (Current goals can be found in the Care Plan section)  Acute Rehab PT Goals Patient Stated Goal: To get stronger with better endurance PT Goal Formulation: With patient Time For Goal Achievement: 05/28/24 Potential to Achieve Goals: Good    Frequency Min 2X/week     Co-evaluation               AM-PAC PT "6 Clicks" Mobility  Outcome Measure Help needed turning from your back to your side while in a flat bed without using bedrails?: A Little Help needed moving from lying on your back to sitting on the side of a flat bed without using bedrails?: A Little Help needed moving to and from a bed to a chair (including a wheelchair)?: A Little Help needed standing up from a chair using your arms (e.g., wheelchair or bedside chair)?: A Little Help needed to walk in hospital room?: A Lot Help needed climbing 3-5 steps with a railing? : Total 6 Click Score: 15    End of Session Equipment Utilized During Treatment: Gait  belt Activity Tolerance: Other (comment) (limited by SOB) Patient left: in chair;with call bell/phone within reach;with chair alarm set Nurse Communication: Mobility status PT Visit Diagnosis: Difficulty in walking, not elsewhere classified (R26.2);Muscle weakness (generalized) (M62.81)    Time: 8469-6295 PT Time Calculation (min) (ACUTE ONLY): 18 min   Charges:   PT Evaluation $PT Eval Moderate Complexity: 1 Mod   PT General Charges $$ ACUTE PT VISIT: 1 Visit       D. Madalyn Scarce PT, DPT 05/15/24, 12:14 PM

## 2024-05-15 NOTE — Progress Notes (Signed)
 [facility]  PROGRESS NOTE   Subjective:   Patient sitting up in chair Complains of fatigue  Sodium 122  Objective:  Vital signs: Blood pressure (!) 176/64, pulse (!) 59, temperature 99.4 F (37.4 C), temperature source Oral, resp. rate 18, height 5' (1.524 m), weight 59.7 kg, SpO2 98%.  Intake/Output Summary (Last 24 hours) at 05/15/2024 1238 Last data filed at 05/15/2024 0900 Gross per 24 hour  Intake 240 ml  Output 300 ml  Net -60 ml   Filed Weights   05/11/24 1052 05/12/24 0955  Weight: 56.7 kg 59.7 kg     Physical Exam: General:  No acute distress  Head:  Normocephalic, atraumatic. Moist oral mucosal membranes  Eyes:  Anicteric  Lungs:   Clear to auscultation, normal effort  Heart:  S1S2 no rubs  Abdomen:   Soft, nontender, bowel sounds present  Extremities:  Trace peripheral edema.  Neurologic:  Awake, alert, following commands  Skin:  No lesions       Basic Metabolic Panel: Recent Labs  Lab 05/11/24 1058 05/12/24 0510 05/12/24 0815 05/13/24 0715 05/13/24 1558 05/14/24 0804 05/14/24 1540 05/14/24 2357 05/15/24 0543 05/15/24 0544 05/15/24 0752  NA 118* 118*   < > 128*   < > 124* 122* 124*  --  124* 122*  K 3.5 4.1  --  3.5  --   --   --   --   --  3.4*  --   CL 80* 85*  --  100  --   --   --   --   --  92*  --   CO2 26 25  --  21*  --   --   --   --   --  23  --   GLUCOSE 117* 93  --  86  --   --   --   --   --  103*  --   BUN 15 9  --  7*  --   --   --   --   --  14  --   CREATININE 0.63 0.46  --  0.50  --   --   --   --   --  0.41*  --   CALCIUM  9.1 8.2*  --  7.8*  --   --   --   --   --  8.1*  --   MG  --   --   --   --   --   --   --   --  1.9  --   --   PHOS  --   --   --   --   --   --   --   --  2.3*  --   --    < > = values in this interval not displayed.   GFR: Estimated Creatinine Clearance: 35.5 mL/min (A) (by C-G formula based on SCr of 0.41 mg/dL (L)).  Liver Function Tests: No results for input(s): "AST", "ALT",  "ALKPHOS", "BILITOT", "PROT", "ALBUMIN" in the last 168 hours. No results for input(s): "LIPASE", "AMYLASE" in the last 168 hours. No results for input(s): "AMMONIA" in the last 168 hours.  CBC: Recent Labs  Lab 05/11/24 1058 05/12/24 0510 05/13/24 0715 05/15/24 0544  WBC 12.0* 9.4 9.0 9.7  NEUTROABS 8.2*  --   --   --   HGB 15.1* 13.9 13.5 13.8  HCT 41.6 38.3 38.1 39.5  MCV 85.8 85.9 87.4 88.0  PLT 350 300 291 250     HbA1C: No results found for: "HGBA1C"  Urinalysis: No results for input(s): "COLORURINE", "LABSPEC", "PHURINE", "GLUCOSEU", "HGBUR", "BILIRUBINUR", "KETONESUR", "PROTEINUR", "UROBILINOGEN", "NITRITE", "LEUKOCYTESUR" in the last 72 hours.  Invalid input(s): "APPERANCEUR"    Imaging: No results found.   Medications:    promethazine  (PHENERGAN ) injection (IM or IVPB) Stopped (05/12/24 1345)   sodium PHOSPHATE  IVPB (in mmol)      amLODipine   5 mg Oral Daily   aspirin   81 mg Oral Daily   atenolol   25 mg Oral BID   benzonatate   100 mg Oral BID   calcium  carbonate  500 mg of elemental calcium  Oral BID WC   Chlorhexidine  Gluconate Cloth  6 each Topical Daily   cholecalciferol   1,000 Units Oral Daily   dextromethorphan -guaiFENesin   1 tablet Oral BID   docusate sodium   100 mg Oral Daily   enoxaparin  (LOVENOX ) injection  40 mg Subcutaneous QHS   feeding supplement  237 mL Oral BID BM   losartan   100 mg Oral Daily   magnesium  oxide  400 mg Oral Daily   multivitamin with minerals  1 tablet Oral Daily   pantoprazole   40 mg Oral BID AC   polyethylene glycol  17 g Oral Daily   sodium chloride   1 g Oral TID WC   tolvaptan   15 mg Oral Once    Assessment/ Plan:   89 year old female with history of hypertension, coronary artery disease, congestive heart failure, gastroesophageal reflux disease now admitted with history of left lower lobe pneumonia. She was admitted with nausea, poor oral intake and weakness. On admission she has a sodium of 118. She was started  on 3% saline.   #1: Hyponatremia: Hyponatremia is most likely secondary to SIADH secondary to pneumonia complicated by intake of hydrochlorothiazide as outpatient.  Hypertonic saline was stopped and patient initiated on salt tabs.  Sodium continues to decrease, 122 today.  Will give one-time dose tolvaptan  15 mg and monitor.   #2: Hypertension: Continue atenolol , amlodipine  and losartan .   #3: Community-acquired pneumonia: Continue daily azithromycin ..  #4: Constipation: Patient has been on docusate and also MiraLAX .  2 BMs recorded overnight      LOS: 4 [facility] 5/19/202512:38 PM

## 2024-05-16 DIAGNOSIS — R338 Other retention of urine: Secondary | ICD-10-CM

## 2024-05-16 DIAGNOSIS — J189 Pneumonia, unspecified organism: Secondary | ICD-10-CM | POA: Diagnosis not present

## 2024-05-16 DIAGNOSIS — E878 Other disorders of electrolyte and fluid balance, not elsewhere classified: Secondary | ICD-10-CM

## 2024-05-16 DIAGNOSIS — E119 Type 2 diabetes mellitus without complications: Secondary | ICD-10-CM | POA: Diagnosis not present

## 2024-05-16 DIAGNOSIS — I10 Essential (primary) hypertension: Secondary | ICD-10-CM | POA: Diagnosis not present

## 2024-05-16 DIAGNOSIS — E871 Hypo-osmolality and hyponatremia: Secondary | ICD-10-CM | POA: Diagnosis not present

## 2024-05-16 LAB — COMPREHENSIVE METABOLIC PANEL WITH GFR
ALT: 22 U/L (ref 0–44)
AST: 25 U/L (ref 15–41)
Albumin: 3.1 g/dL — ABNORMAL LOW (ref 3.5–5.0)
Alkaline Phosphatase: 56 U/L (ref 38–126)
Anion gap: 7 (ref 5–15)
BUN: 14 mg/dL (ref 8–23)
CO2: 25 mmol/L (ref 22–32)
Calcium: 8.5 mg/dL — ABNORMAL LOW (ref 8.9–10.3)
Chloride: 96 mmol/L — ABNORMAL LOW (ref 98–111)
Creatinine, Ser: 0.57 mg/dL (ref 0.44–1.00)
GFR, Estimated: >60 mL/min (ref >60)
Glucose, Bld: 112 mg/dL — ABNORMAL HIGH (ref 70–99)
Potassium: 4.1 mmol/L (ref 3.5–5.1)
Sodium: 128 mmol/L — ABNORMAL LOW (ref 135–145)
Total Bilirubin: 0.8 mg/dL (ref 0.0–1.2)
Total Protein: 5.4 g/dL — ABNORMAL LOW (ref 6.5–8.1)

## 2024-05-16 LAB — SODIUM
Sodium: 130 mmol/L — ABNORMAL LOW (ref 135–145)
Sodium: 131 mmol/L — ABNORMAL LOW (ref 135–145)

## 2024-05-16 LAB — CBC
HCT: 37.6 % (ref 36.0–46.0)
Hemoglobin: 13.2 g/dL (ref 12.0–15.0)
MCH: 30.9 pg (ref 26.0–34.0)
MCHC: 35.1 g/dL (ref 30.0–36.0)
MCV: 88.1 fL (ref 80.0–100.0)
Platelets: 265 10*3/uL (ref 150–400)
RBC: 4.27 MIL/uL (ref 3.87–5.11)
RDW: 13.3 % (ref 11.5–15.5)
WBC: 9.5 10*3/uL (ref 4.0–10.5)
nRBC: 0 % (ref 0.0–0.2)

## 2024-05-16 MED ORDER — AMLODIPINE BESYLATE 10 MG PO TABS
10.0000 mg | ORAL_TABLET | Freq: Every day | ORAL | Status: DC
  Administered 2024-05-17: 10 mg via ORAL
  Filled 2024-05-16: qty 1

## 2024-05-16 MED ORDER — TOLVAPTAN 15 MG PO TABS
15.0000 mg | ORAL_TABLET | Freq: Once | ORAL | Status: AC
  Administered 2024-05-16: 15 mg via ORAL
  Filled 2024-05-16: qty 1

## 2024-05-16 NOTE — Care Management Important Message (Signed)
 Important Message  Patient Details  Name: Heather Miranda MRN: [id number] Date of Birth: February 17, 1929   Important Message Given:  Yes - Medicare IM     Jacqueli Pangallo W, CMA 05/16/2024, 11:57 AM

## 2024-05-16 NOTE — Progress Notes (Signed)
 MEDICATION RELATED CONSULT NOTE  Pharmacy Consult for Tolvaptan  Indication: hyponatremia  Allergies  Allergen Reactions   Morphine And Codeine Nausea Only   Oxycontin [Oxycodone] Nausea Only   Percocet [Oxycodone-Acetaminophen ] Nausea Only   Morphine Nausea Only   Oxycodone-Acetaminophen  Rash    Patient Measurements: Height: 5' (152.4 cm) Weight: 59.7 kg (131 lb 9.8 oz) IBW/kg (Calculated) : 45.5   Vital Signs: Temp: 98.3 F (36.8 C) (05/20 0414) Temp Source: Oral (05/19 1928) BP: 139/71 (05/20 0414) Pulse Rate: 64 (05/20 0414) Intake/Output from previous day: 05/19 0701 - 05/20 0700 In: 633.6 [P.O.:360; IV Piggyback:273.6] Out: 1500 [Urine:1500] Intake/Output from this shift: Total I/O In: 273.6 [IV Piggyback:273.6] Out: -   Labs: Recent Labs    05/13/24 0715 05/15/24 0543 05/15/24 0544 05/15/24 1545 05/16/24 0422  WBC 9.0  --  9.7  --  9.5  HGB 13.5  --  13.8  --  13.2  HCT 38.1  --  39.5  --  37.6  PLT 291  --  250  --  265  CREATININE 0.50  --  0.41* 0.49 0.57  MG  --  1.9  --   --   --   PHOS  --  2.3*  --   --   --   ALBUMIN  --   --   --  3.1* 3.1*  PROT  --   --   --  5.6* 5.4*  AST  --   --   --  30 25  ALT  --   --   --  22 22  ALKPHOS  --   --   --  76 56  BILITOT  --   --   --  0.7 0.8  BILIDIR  --   --   --  0.1  --   IBILI  --   --   --  0.6  --    Estimated Creatinine Clearance: 35.5 mL/min (by C-G formula based on SCr of 0.57 mg/dL).   Medical History: Past Medical History:  Diagnosis Date   Arthritis    Dysrhythmia    Environmental and seasonal allergies    GERD (gastroesophageal reflux disease)    Hernia, hiatal    Hx of dysplastic nevus 09/13/2015   L mid back paraspinal, moderate atypia    Assessment: 89 yo female presented to the ED with primary complaint of weakness. Found to be hyponatremic with Na = 118.  Patient was previously on NaCl 3% infusion from 5/16-5/17.  Most recent Na = 122.  Patient currently on NaCl 1 g po  TID.  Nephrology has ordered Tolvaptan  15 mg po x 1.  5/19 0751 Na 122 5/19 1407 Tolvaptan  15 mg PO X 1 given 5/19 1545 Na 122 5/19 2029 Na 128 5/20 0422 Na 128  Goal of Therapy:  Desired rate of correction: should not exceed 8 mEq/L per 8 hours or 12 mEq/L in 24 hours.   Plan:  Tolvaptan  15 mg po x 1 ordered by nephrology Monitor Na level every 8 hours for the first 24 hours If too rapid a correction is noted please contact provider  Rukaya Kleinschmidt D, PharmD 05/16/2024,6:14 AM

## 2024-05-16 NOTE — Plan of Care (Signed)

## 2024-05-16 NOTE — Progress Notes (Signed)
 MEDICATION RELATED CONSULT NOTE  Pharmacy Consult for Tolvaptan  Indication: hyponatremia  Allergies  Allergen Reactions   Morphine And Codeine Nausea Only   Oxycontin [Oxycodone] Nausea Only   Percocet [Oxycodone-Acetaminophen ] Nausea Only   Morphine Nausea Only   Oxycodone-Acetaminophen  Rash    Patient Measurements: Height: 5' (152.4 cm) Weight: 59.7 kg (131 lb 9.8 oz) IBW/kg (Calculated) : 45.5   Vital Signs: Temp: 97.9 F (36.6 C) (05/20 0826) BP: 168/60 (05/20 0826) Pulse Rate: 64 (05/20 0826) Intake/Output from previous day: 05/19 0701 - 05/20 0700 In: 633.6 [P.O.:360; IV Piggyback:273.6] Out: 1500 [Urine:1500] Intake/Output from this shift: No intake/output data recorded.  Labs: Recent Labs    05/15/24 0543 05/15/24 0544 05/15/24 1545 05/16/24 0422  WBC  --  9.7  --  9.5  HGB  --  13.8  --  13.2  HCT  --  39.5  --  37.6  PLT  --  250  --  265  CREATININE  --  0.41* 0.49 0.57  MG 1.9  --   --   --   PHOS 2.3*  --   --   --   ALBUMIN  --   --  3.1* 3.1*  PROT  --   --  5.6* 5.4*  AST  --   --  30 25  ALT  --   --  22 22  ALKPHOS  --   --  76 56  BILITOT  --   --  0.7 0.8  BILIDIR  --   --  0.1  --   IBILI  --   --  0.6  --    Estimated Creatinine Clearance: 35.5 mL/min (by C-G formula based on SCr of 0.57 mg/dL).   Medical History: Past Medical History:  Diagnosis Date   Arthritis    Dysrhythmia    Environmental and seasonal allergies    GERD (gastroesophageal reflux disease)    Hernia, hiatal    Hx of dysplastic nevus 09/13/2015   L mid back paraspinal, moderate atypia    Assessment: 89 yo female presented to the ED with primary complaint of weakness. Found to be hyponatremic with Na = 118.  Patient was previously on NaCl 3% infusion from 5/16-5/17.  Most recent Na = 122.  Patient currently on NaCl 1 g po TID.  Nephrology has ordered Tolvaptan  15 mg po x 1.  5/19 0751 Na 122 5/19 1407 Tolvaptan  15 mg PO X 1 given 5/19 1545 Na 122 5/19  2029 Na 128 5/20 0422 Na 128 5/20 0922 Tolvaptan  po x 1 given 0520 1157 Na 130  Goal of Therapy:  Desired rate of correction: should not exceed 8 mEq/L per 8 hours or 12 mEq/L in 24 hours.   Plan:  Tolvaptan  15 mg po x 1 ordered by nephrology Monitor Na level every 8 hours for the first 24 hours If too rapid a correction is noted please contact provider  Ramonita Burow, PharmD 05/16/2024,9:11 AM

## 2024-05-16 NOTE — Progress Notes (Signed)
 Progress Note   Patient: Heather Miranda NFA:[id number] DOB: 01-04-1929 DOA: 05/11/2024     5 DOS: the patient was seen and examined on 05/16/2024   Brief hospital course: Ms. Dannisha Eckmann is a 89 year old female with history of hypertension, GERD, who presents from [facility] of [place] for chief concerns of cough, congestion for 2 weeks.  Vitals in the ED showed T of 90, rr 23, hr 72, blood pressure 145/74, SpO2 of 94% on room air.  Serum sodium is 118, potassium 3.5, chloride 80, bicarb 26, BUN of 15, serum creatinine 2.63, EGFR greater than 60, nonfasting blood glucose 117, WBC 12, hemoglobin 15.1, platelets of 350. BNP was 99.  ED treatment: Azithromycin  500 mg IV one-time dose, ceftriaxone  IV one-time dose, sodium chloride  1 L bolus.  5/16: Vitals stable, sodium remained at 118 after getting 1 L of normal saline. Worsening weakness, hyponatremia labs ordered and transferring to stepdown to start on hypertonic saline.  Nephrology was consulted. Patient was on HCTZ at home which should not be continued on discharge.  5/17: Hemodynamically stable, sodium improved to 128.  Hypertonic saline was discontinued and she was started on normal saline at 40 mL/h. Expanded respiratory panel negative.  Pending PT and OT evaluation  5/18: Vital stable, sodium again started declining slowly, currently at 124.  Adding salt tablets.  5/19: Hemodynamically stable but sodium slowly decreasing despite taking salt tablets.  Nephrology is giving 1 dose of tolvaptan  15 mg.  Mild hypophosphatemia and hypokalemia which are being repleted. Foley catheter to be removed today to give her a voiding trial. PT and OT are recommending SNF.  5/20: Sodium improved to 128 s/p 1 dose of tolvaptan . Getting stable for SNF.  Assessment and Plan: * Hyponatremia Acute on chronic hyponatremia.  Sodium improved to 128 today after getting a dose of tolvaptan  on 5/19 Was on HCTZ and seems like having slowly worsening chronic  hyponatremia, HCTZ should not be continued at this time. Hyponatremia labs with hypotonic hyponatremia, normal urine osmolality and urinary sodium of 106. Initially received hypertonic saline which was discontinued once sodium reached 128, started trending down again -S/p 1 dose of tolvaptan  on 5/19 - Continue salt tablet -Monitor sodium -Encourage to drink more water with electrolyte instead of just free water -Patient should not be restarted on HCTZ  CAP (community acquired pneumonia) Patient with history of upper respiratory symptoms for the past couple of weeks, received 2 courses of antibiotics. Chest x-ray without any abnormality and procalcitonin negative Strep pneumo negative, MRSA PCR negative -Discontinue antibiotics -Supportive care  Benign essential hypertension Blood pressure mildly elevated Increasing the dose of amlodipine  to 10 mg daily Continue home atenolol  25 mg p.o. twice daily, losartan  100 mg daily  Discontinuing home HCTZ for concern of progressively worsening chronic hyponatremia, HCTZ should not be resumed on discharge Hydralazine  5 mg IV every 6 hours as needed for SBP greater than 170, 5 days ordered  Type 2 diabetes mellitus without complication (HCC) Patient is current not on insulin and or oral antiglycemic agents CBG within goal -Continue to monitor  GERD (gastroesophageal reflux disease) - Continue home PPI  Acute urinary retention Resolved.  Able to void well after removal of Foley catheter  Electrolyte abnormality Resolved with repletion -Continue to monitor and replete as needed   Subjective: Patient was feeling much improved when seen today.  No new concern.  Physical Exam: Vitals:   05/15/24 1617 05/15/24 1928 05/16/24 0414 05/16/24 0826  BP: (!) 139/57 (!) 145/64 139/71 [name]  168/60  Pulse: 66 69 64 64  Resp: 18 18 16 17   Temp: 97.6 F (36.4 C) (!) 97.5 F (36.4 C) 98.3 F (36.8 C) 97.9 F (36.6 C)  TempSrc: Oral Oral    SpO2:  99% 95% 95% 98%  Weight:      Height:       General.  Frail elderly lady, in no acute distress. Pulmonary.  Lungs clear bilaterally, normal respiratory effort. CV.  Regular rate and rhythm, no JVD, rub or murmur. Abdomen.  Soft, nontender, nondistended, BS positive. CNS.  Alert and oriented .  No focal neurologic deficit. Extremities.  No edema, no cyanosis, pulses intact and symmetrical.  Data Reviewed: Prior data reviewed  Family Communication: Discussed with son and another family member at bedside  Disposition: Status is: Inpatient Remains inpatient appropriate because: Severity of illness  Planned Discharge Destination: Home with Home Health  DVT prophylaxis.  Lovenox   Time spent: 45 minutes  This record has been created using Conservation officer, historic buildings. Errors have been sought and corrected,but may not always be located. Such creation errors do not reflect on the standard of care.   Author: Luna Salinas, MD 05/16/2024 1:52 PM  For on call review [email or web address].

## 2024-05-16 NOTE — Plan of Care (Signed)
  Problem: Education: Goal: Knowledge of General Education information will improve Description: Including pain rating scale, medication(s)/side effects and non-pharmacologic comfort measures 05/16/2024 0113 by Rayleen Cal, RN Outcome: Progressing 05/16/2024 0113 by Rayleen Cal, RN Outcome: Progressing   Problem: Health Behavior/Discharge Planning: Goal: Ability to manage health-related needs will improve 05/16/2024 0113 by Rayleen Cal, RN Outcome: Progressing 05/16/2024 0113 by Rayleen Cal, RN Outcome: Progressing   Problem: Clinical Measurements: Goal: Ability to maintain clinical measurements within normal limits will improve 05/16/2024 0113 by Rayleen Cal, RN Outcome: Progressing 05/16/2024 0113 by Rayleen Cal, RN Outcome: Progressing Goal: Will remain free from infection 05/16/2024 0113 by Rayleen Cal, RN Outcome: Progressing 05/16/2024 0113 by Rayleen Cal, RN Outcome: Progressing Goal: Diagnostic test results will improve 05/16/2024 0113 by Rayleen Cal, RN Outcome: Progressing 05/16/2024 0113 by Rayleen Cal, RN Outcome: Progressing Goal: Respiratory complications will improve 05/16/2024 0113 by Rayleen Cal, RN Outcome: Progressing 05/16/2024 0113 by Rayleen Cal, RN Outcome: Progressing Goal: Cardiovascular complication will be avoided 05/16/2024 0113 by Rayleen Cal, RN Outcome: Progressing 05/16/2024 0113 by Rayleen Cal, RN Outcome: Progressing   Problem: Activity: Goal: Risk for activity intolerance will decrease 05/16/2024 0113 by Rayleen Cal, RN Outcome: Progressing 05/16/2024 0113 by Rayleen Cal, RN Outcome: Progressing   Problem: Nutrition: Goal: Adequate nutrition will be maintained 05/16/2024 0113 by Rayleen Cal, RN Outcome: Progressing 05/16/2024 0113 by Rayleen Cal, RN Outcome: Progressing   Problem: Coping: Goal: Level of anxiety will decrease 05/16/2024  0113 by Rayleen Cal, RN Outcome: Progressing 05/16/2024 0113 by Rayleen Cal, RN Outcome: Progressing   Problem: Elimination: Goal: Will not experience complications related to bowel motility 05/16/2024 0113 by Rayleen Cal, RN Outcome: Progressing 05/16/2024 0113 by Rayleen Cal, RN Outcome: Progressing Goal: Will not experience complications related to urinary retention 05/16/2024 0113 by Rayleen Cal, RN Outcome: Progressing 05/16/2024 0113 by Rayleen Cal, RN Outcome: Progressing   Problem: Pain Managment: Goal: General experience of comfort will improve and/or be controlled 05/16/2024 0113 by Rayleen Cal, RN Outcome: Progressing 05/16/2024 0113 by Rayleen Cal, RN Outcome: Progressing   Problem: Safety: Goal: Ability to remain free from injury will improve 05/16/2024 0113 by Rayleen Cal, RN Outcome: Progressing 05/16/2024 0113 by Rayleen Cal, RN Outcome: Progressing   Problem: Skin Integrity: Goal: Risk for impaired skin integrity will decrease 05/16/2024 0113 by Rayleen Cal, RN Outcome: Progressing 05/16/2024 0113 by Rayleen Cal, RN Outcome: Progressing

## 2024-05-16 NOTE — Progress Notes (Signed)
 [facility]  PROGRESS NOTE   Subjective:   Patient up in bed Ill appearing today, frail States she doesn't feel as well as yesterday Unable to provide specific complaints  Sodium 128  Objective:  Vital signs: Blood pressure (!) 168/60, pulse 64, temperature 97.9 F (36.6 C), resp. rate 17, height 5' (1.524 m), weight 59.7 kg, SpO2 98%.  Intake/Output Summary (Last 24 hours) at 05/16/2024 1208 Last data filed at 05/16/2024 0900 Gross per 24 hour  Intake 633.57 ml  Output 1500 ml  Net -866.43 ml   Filed Weights   05/11/24 1052 05/12/24 0955  Weight: 56.7 kg 59.7 kg     Physical Exam: General:  No acute distress  Head:  Normocephalic, atraumatic. Moist oral mucosal membranes  Eyes:  Anicteric  Lungs:   Clear to auscultation, normal effort  Heart:  S1S2 no rubs  Abdomen:   Soft, nontender, bowel sounds present  Extremities:  No peripheral edema.  Neurologic:  Awake, alert, following commands  Skin:  No lesions       Basic Metabolic Panel: Recent Labs  Lab 05/12/24 0510 05/12/24 0815 05/13/24 0715 05/13/24 1558 05/15/24 0543 05/15/24 0544 05/15/24 0752 05/15/24 1545 05/15/24 2029 05/16/24 0422  NA 118*   < > 128*   < >  --  124* 122* 122* 128* 128*  K 4.1  --  3.5  --   --  3.4*  --  4.1  --  4.1  CL 85*  --  100  --   --  92*  --  89*  --  96*  CO2 25  --  21*  --   --  23  --  23  --  25  GLUCOSE 93  --  86  --   --  103*  --  150*  --  112*  BUN 9  --  7*  --   --  14  --  16  --  14  CREATININE 0.46  --  0.50  --   --  0.41*  --  0.49  --  0.57  CALCIUM  8.2*  --  7.8*  --   --  8.1*  --  8.2*  --  8.5*  MG  --   --   --   --  1.9  --   --   --   --   --   PHOS  --   --   --   --  2.3*  --   --   --   --   --    < > = values in this interval not displayed.   GFR: Estimated Creatinine Clearance: 35.5 mL/min (by C-G formula based on SCr of 0.57 mg/dL).  Liver Function Tests: Recent Labs  Lab 05/15/24 1545 05/16/24 0422  AST 30 25   ALT 22 22  ALKPHOS 76 56  BILITOT 0.7 0.8  PROT 5.6* 5.4*  ALBUMIN 3.1* 3.1*   No results for input(s): "LIPASE", "AMYLASE" in the last 168 hours. No results for input(s): "AMMONIA" in the last 168 hours.  CBC: Recent Labs  Lab 05/11/24 1058 05/12/24 0510 05/13/24 0715 05/15/24 0544 05/16/24 0422  WBC 12.0* 9.4 9.0 9.7 9.5  NEUTROABS 8.2*  --   --   --   --   HGB 15.1* 13.9 13.5 13.8 13.2  HCT 41.6 38.3 38.1 39.5 37.6  MCV 85.8 85.9 87.4 88.0 88.1  PLT 350 300 291 250 265  HbA1C: No results found for: "HGBA1C"  Urinalysis: No results for input(s): "COLORURINE", "LABSPEC", "PHURINE", "GLUCOSEU", "HGBUR", "BILIRUBINUR", "KETONESUR", "PROTEINUR", "UROBILINOGEN", "NITRITE", "LEUKOCYTESUR" in the last 72 hours.  Invalid input(s): "APPERANCEUR"    Imaging: No results found.   Medications:    promethazine  (PHENERGAN ) injection (IM or IVPB) Stopped (05/12/24 1345)    amLODipine   5 mg Oral Daily   aspirin   81 mg Oral Daily   atenolol   25 mg Oral BID   benzonatate   100 mg Oral BID   calcium  carbonate  500 mg of elemental calcium  Oral BID WC   Chlorhexidine  Gluconate Cloth  6 each Topical Daily   cholecalciferol   1,000 Units Oral Daily   dextromethorphan -guaiFENesin   1 tablet Oral BID   docusate sodium   100 mg Oral Daily   enoxaparin  (LOVENOX ) injection  40 mg Subcutaneous QHS   feeding supplement  237 mL Oral BID BM   losartan   100 mg Oral Daily   magnesium  oxide  400 mg Oral Daily   multivitamin with minerals  1 tablet Oral Daily   pantoprazole   40 mg Oral BID AC   polyethylene glycol  17 g Oral Daily   sodium chloride   1 g Oral TID WC    Assessment/ Plan:   89 year old female with history of hypertension, coronary artery disease, congestive heart failure, gastroesophageal reflux disease now admitted with history of left lower lobe pneumonia. She was admitted with nausea, poor oral intake and weakness. On admission she has a sodium of 118. She was started  on 3% saline.   #1: Hyponatremia: Hyponatremia is most likely secondary to SIADH secondary to pneumonia complicated by intake of hydrochlorothiazide as outpatient.    Sodium responded well to Tolvaptan  yesterday, 128 today. Will give a dose today as well, tovaptan 15mg .    #2: Hypertension: Continue atenolol , amlodipine  and losartan .   #3: Community-acquired pneumonia: Completed azithromycin .Aaron Aas  #4: Constipation: Patient has been on docusate and also MiraLAX .  Resolved      LOS: 5 [facility] 5/20/202512:08 PM

## 2024-05-17 DIAGNOSIS — R2681 Unsteadiness on feet: Secondary | ICD-10-CM | POA: Diagnosis not present

## 2024-05-17 DIAGNOSIS — I10 Essential (primary) hypertension: Secondary | ICD-10-CM | POA: Diagnosis not present

## 2024-05-17 DIAGNOSIS — E878 Other disorders of electrolyte and fluid balance, not elsewhere classified: Secondary | ICD-10-CM | POA: Diagnosis not present

## 2024-05-17 DIAGNOSIS — J189 Pneumonia, unspecified organism: Secondary | ICD-10-CM | POA: Diagnosis not present

## 2024-05-17 DIAGNOSIS — J309 Allergic rhinitis, unspecified: Secondary | ICD-10-CM | POA: Diagnosis not present

## 2024-05-17 DIAGNOSIS — R338 Other retention of urine: Secondary | ICD-10-CM | POA: Diagnosis not present

## 2024-05-17 DIAGNOSIS — E871 Hypo-osmolality and hyponatremia: Secondary | ICD-10-CM | POA: Diagnosis not present

## 2024-05-17 DIAGNOSIS — E119 Type 2 diabetes mellitus without complications: Secondary | ICD-10-CM | POA: Diagnosis not present

## 2024-05-17 DIAGNOSIS — K219 Gastro-esophageal reflux disease without esophagitis: Secondary | ICD-10-CM | POA: Diagnosis not present

## 2024-05-17 DIAGNOSIS — R2689 Other abnormalities of gait and mobility: Secondary | ICD-10-CM | POA: Diagnosis not present

## 2024-05-17 DIAGNOSIS — M6281 Muscle weakness (generalized): Secondary | ICD-10-CM | POA: Diagnosis not present

## 2024-05-17 DIAGNOSIS — R41841 Cognitive communication deficit: Secondary | ICD-10-CM | POA: Diagnosis not present

## 2024-05-17 LAB — RENAL FUNCTION PANEL
Albumin: 3.2 g/dL — ABNORMAL LOW (ref 3.5–5.0)
Anion gap: 7 (ref 5–15)
BUN: 19 mg/dL (ref 8–23)
CO2: 25 mmol/L (ref 22–32)
Calcium: 8.8 mg/dL — ABNORMAL LOW (ref 8.9–10.3)
Chloride: 97 mmol/L — ABNORMAL LOW (ref 98–111)
Creatinine, Ser: 0.56 mg/dL (ref 0.44–1.00)
GFR, Estimated: >60 mL/min (ref >60)
Glucose, Bld: 112 mg/dL — ABNORMAL HIGH (ref 70–99)
Phosphorus: 3.4 mg/dL (ref 2.5–4.6)
Potassium: 4.2 mmol/L (ref 3.5–5.1)
Sodium: 129 mmol/L — ABNORMAL LOW (ref 135–145)

## 2024-05-17 LAB — CBC
HCT: 39.9 % (ref 36.0–46.0)
Hemoglobin: 13.9 g/dL (ref 12.0–15.0)
MCH: 30.8 pg (ref 26.0–34.0)
MCHC: 34.8 g/dL (ref 30.0–36.0)
MCV: 88.3 fL (ref 80.0–100.0)
Platelets: 264 10*3/uL (ref 150–400)
RBC: 4.52 MIL/uL (ref 3.87–5.11)
RDW: 13.6 % (ref 11.5–15.5)
WBC: 8.8 10*3/uL (ref 4.0–10.5)
nRBC: 0 % (ref 0.0–0.2)

## 2024-05-17 LAB — SODIUM: Sodium: 129 mmol/L — ABNORMAL LOW (ref 135–145)

## 2024-05-17 LAB — MAGNESIUM: Magnesium: 2.1 mg/dL (ref 1.7–2.4)

## 2024-05-17 MED ORDER — DM-GUAIFENESIN ER 30-600 MG PO TB12: 1.0000 | ORAL_TABLET | Freq: Two times a day (BID) | ORAL | Status: AC

## 2024-05-17 MED ORDER — AMLODIPINE BESYLATE 10 MG PO TABS: 10.0000 mg | ORAL_TABLET | Freq: Every day | ORAL | Status: AC

## 2024-05-17 MED ORDER — SODIUM CHLORIDE 1 G PO TABS: 1.0000 g | ORAL_TABLET | Freq: Three times a day (TID) | ORAL | Status: AC

## 2024-05-17 MED ORDER — LACTULOSE 10 GM/15ML PO SOLN: 20.0000 g | Freq: Two times a day (BID) | ORAL | Status: AC | PRN

## 2024-05-17 MED ORDER — ENSURE ENLIVE PO LIQD: 237.0000 mL | Freq: Two times a day (BID) | ORAL | Status: AC

## 2024-05-17 MED ORDER — BENZONATATE 100 MG PO CAPS: 100.0000 mg | ORAL_CAPSULE | Freq: Two times a day (BID) | ORAL | Status: AC

## 2024-05-17 MED ORDER — DOCUSATE SODIUM 100 MG PO CAPS: 100.0000 mg | ORAL_CAPSULE | Freq: Every day | ORAL | Status: AC

## 2024-05-17 MED ORDER — LOSARTAN POTASSIUM 100 MG PO TABS: 100.0000 mg | ORAL_TABLET | Freq: Every day | ORAL | Status: AC

## 2024-05-17 NOTE — Progress Notes (Addendum)
 [facility]  PROGRESS NOTE   Subjective:   Patient seen laying in bed Alert and oriented States she probably drink more water yesterday due to frequent urination  Sodium 129  Objective:  Vital signs: Blood pressure (!) 138/110, pulse 73, temperature 98.5 F (36.9 C), temperature source Oral, resp. rate 15, height 5' (1.524 m), weight 59.7 kg, SpO2 97%.  Intake/Output Summary (Last 24 hours) at 05/17/2024 1216 Last data filed at 05/17/2024 0900 Gross per 24 hour  Intake 360 ml  Output --  Net 360 ml   Filed Weights   05/11/24 1052 05/12/24 0955  Weight: 56.7 kg 59.7 kg     Physical Exam: General:  No acute distress  Head:  Normocephalic, atraumatic. Moist oral mucosal membranes  Eyes:  Anicteric  Lungs:   Clear to auscultation, normal effort  Heart:  S1S2 no rubs  Abdomen:   Soft, nontender, bowel sounds present  Extremities:  No peripheral edema.  Neurologic:  Awake, alert, following commands  Skin:  No lesions       Basic Metabolic Panel: Recent Labs  Lab 05/13/24 0715 05/13/24 1558 05/15/24 0543 05/15/24 0544 05/15/24 0752 05/15/24 1545 05/15/24 2029 05/16/24 0422 05/16/24 1157 05/16/24 2014 05/17/24 0531  NA 128*   < >  --  124*   < > 122* 128* 128* 130* 131* 129*  K 3.5  --   --  3.4*  --  4.1  --  4.1  --   --  4.2  CL 100  --   --  92*  --  89*  --  96*  --   --  97*  CO2 21*  --   --  23  --  23  --  25  --   --  25  GLUCOSE 86  --   --  103*  --  150*  --  112*  --   --  112*  BUN 7*  --   --  14  --  16  --  14  --   --  19  CREATININE 0.50  --   --  0.41*  --  0.49  --  0.57  --   --  0.56  CALCIUM  7.8*  --   --  8.1*  --  8.2*  --  8.5*  --   --  8.8*  MG  --   --  1.9  --   --   --   --   --   --   --  2.1  PHOS  --   --  2.3*  --   --   --   --   --   --   --  3.4   < > = values in this interval not displayed.   GFR: Estimated Creatinine Clearance: 35.5 mL/min (by C-G formula based on SCr of 0.56 mg/dL).  Liver Function  Tests: Recent Labs  Lab 05/15/24 1545 05/16/24 0422 05/17/24 0531  AST 30 25  --   ALT 22 22  --   ALKPHOS 76 56  --   BILITOT 0.7 0.8  --   PROT 5.6* 5.4*  --   ALBUMIN 3.1* 3.1* 3.2*   No results for input(s): "LIPASE", "AMYLASE" in the last 168 hours. No results for input(s): "AMMONIA" in the last 168 hours.  CBC: Recent Labs  Lab 05/11/24 1058 05/12/24 0510 05/13/24 0715 05/15/24 0544 05/16/24 0422 05/17/24 0531  WBC 12.0* 9.4 9.0 9.7 9.5  8.8  NEUTROABS 8.2*  --   --   --   --   --   HGB 15.1* 13.9 13.5 13.8 13.2 13.9  HCT 41.6 38.3 38.1 39.5 37.6 39.9  MCV 85.8 85.9 87.4 88.0 88.1 88.3  PLT 350 300 291 250 265 264     HbA1C: No results found for: "HGBA1C"  Urinalysis: No results for input(s): "COLORURINE", "LABSPEC", "PHURINE", "GLUCOSEU", "HGBUR", "BILIRUBINUR", "KETONESUR", "PROTEINUR", "UROBILINOGEN", "NITRITE", "LEUKOCYTESUR" in the last 72 hours.  Invalid input(s): "APPERANCEUR"    Imaging: No results found.   Medications:    promethazine  (PHENERGAN ) injection (IM or IVPB) Stopped (05/12/24 1345)    amLODipine   10 mg Oral Daily   aspirin   81 mg Oral Daily   atenolol   25 mg Oral BID   benzonatate   100 mg Oral BID   calcium  carbonate  500 mg of elemental calcium  Oral BID WC   Chlorhexidine  Gluconate Cloth  6 each Topical Daily   cholecalciferol   1,000 Units Oral Daily   dextromethorphan -guaiFENesin   1 tablet Oral BID   docusate sodium   100 mg Oral Daily   enoxaparin  (LOVENOX ) injection  40 mg Subcutaneous QHS   feeding supplement  237 mL Oral BID BM   losartan   100 mg Oral Daily   magnesium  oxide  400 mg Oral Daily   multivitamin with minerals  1 tablet Oral Daily   pantoprazole   40 mg Oral BID AC   polyethylene glycol  17 g Oral Daily   sodium chloride   1 g Oral TID WC    Assessment/ Plan:   89 year old female with history of hypertension, coronary artery disease, congestive heart failure, gastroesophageal reflux disease now admitted  with history of left lower lobe pneumonia. She was admitted with nausea, poor oral intake and weakness. On admission she has a sodium of 118. She was started on 3% saline.   #1: Hyponatremia: Hyponatremia is most likely secondary to SIADH secondary to pneumonia complicated by intake of hydrochlorothiazide as outpatient.    Sodium did improve to 131 last night, 129 this morning.  Patient given 1 dose of tolvaptan  15 mg yesterday.  Discussed with patient the need for fluid restriction, ordered 1500 mL daily fluid restriction.  Encouraged patient to continue this at discharge along with salt tabs.  Will order close follow-up in our office to monitor.   #2: Hypertension: Continue atenolol , amlodipine  and losartan .  Blood pressure elevated today.  Continue current regimen.   #3: Community-acquired pneumonia: Completed azithromycin .Aaron Aas  #4: Constipation: Patient has been on docusate and also MiraLAX .  Resolved      LOS: 6 [facility] 5/21/202512:16 PM

## 2024-05-17 NOTE — Plan of Care (Signed)

## 2024-05-17 NOTE — Discharge Summary (Signed)
 Physician Discharge Summary   Patient: Heather Miranda MRN: [id number] DOB: [birth year]  Admit date:     05/11/2024  Discharge date: 05/17/24  Discharge Physician: Luna Salinas   PCP: Yehuda Helms, MD   Recommendations at discharge:  Please obtain CBC and BMP and follow-up Please monitor sodium and titrate salt tablets Limit free water use to 1500 mL-encourage water with electrolyte. Please do not restart hydrochlorothiazide Follow-up with primary care provider Follow-up with nephrology  Discharge Diagnoses: Principal Problem:   Hyponatremia Active Problems:   CAP (community acquired pneumonia)   Benign essential hypertension   Type 2 diabetes mellitus without complication (HCC)   GERD (gastroesophageal reflux disease)   Acute urinary retention   Electrolyte abnormality   Hospital Course: Heather Miranda is a 89 year old female with history of hypertension, GERD, who presents from [facility] [place] for chief concerns of cough, congestion for 2 weeks.  Vitals in the ED showed T of 90, rr 23, hr 72, blood pressure 145/74, SpO2 of 94% on room air.  Serum sodium is 118, potassium 3.5, chloride 80, bicarb 26, BUN of 15, serum creatinine 2.63, EGFR greater than 60, nonfasting blood glucose 117, WBC 12, hemoglobin 15.1, platelets of 350. BNP was 99.  ED treatment: Azithromycin  500 mg IV one-time dose, ceftriaxone  IV one-time dose, sodium chloride  1 L bolus.  5/16: Vitals stable, sodium remained at 118 after getting 1 L of normal saline. Worsening weakness, hyponatremia labs ordered and transferring to stepdown to start on hypertonic saline.  Nephrology was consulted. Patient was on HCTZ at home which should not be continued on discharge.  5/17: Hemodynamically stable, sodium improved to 128.  Hypertonic saline was discontinued and she was started on normal saline at 40 mL/h. Expanded respiratory panel negative.  Pending PT and OT evaluation  5/18: Vital stable, sodium again  started declining slowly, currently at 124.  Adding salt tablets.  5/19: Hemodynamically stable but sodium slowly decreasing despite taking salt tablets.  Nephrology is giving 1 dose of tolvaptan  15 mg.  Mild hypophosphatemia and hypokalemia which are being repleted. Foley catheter to be removed today to give her a voiding trial. PT and OT are recommending SNF.  5/20: Sodium improved to 128 s/p 1 dose of tolvaptan .  5/21: Sodium at 129 today otherwise stable labs.  Patient was instructed to limit fluid to 1500 mL, should not drink more free water and juice water with electrolytes.  She will also continue salt tablets and follow-up closely with nephrology as outpatient for further management of chronic hyponatremia.  We increased her home amlodipine  dose to 10 mg daily and discontinue HCTZ which should not be restarted.  Her PCP can add another agent if needed except thiazide diuretic.  She will continue the rest of her home medications and follow-up closely with her providers for further assistance.  Assessment and Plan: * Hyponatremia Acute on chronic hyponatremia.  Sodium improved to 129 today after getting a dose of tolvaptan  on 5/19 Was on HCTZ and seems like having slowly worsening chronic hyponatremia, HCTZ should not be continued at this time. Hyponatremia labs with hypotonic hyponatremia, normal urine osmolality and urinary sodium of 106. Initially received hypertonic saline which was discontinued once sodium reached 128, started trending down again -S/p 1 dose of tolvaptan  on 5/19 - Continue salt tablet -Monitor sodium -Encourage to drink more water with electrolyte instead of just free water -Patient should not be restarted on HCTZ  CAP (community acquired pneumonia) Patient with history of upper  respiratory symptoms for the past couple of weeks, received 2 courses of antibiotics. Chest x-ray without any abnormality and procalcitonin negative Strep pneumo negative, MRSA PCR  negative -Discontinue antibiotics -Supportive care  Benign essential hypertension Blood pressure mildly elevated Increasing the dose of amlodipine  to 10 mg daily Continue home atenolol  25 mg p.o. twice daily, losartan  100 mg daily  Discontinuing home HCTZ for concern of progressively worsening chronic hyponatremia, HCTZ should not be resumed on discharge Hydralazine  5 mg IV every 6 hours as needed for SBP greater than 170, 5 days ordered  Type 2 diabetes mellitus without complication (HCC) Patient is current not on insulin and or oral antiglycemic agents CBG within goal -Continue to monitor  GERD (gastroesophageal reflux disease) - Continue home PPI  Acute urinary retention Resolved.  Able to void well after removal of Foley catheter  Electrolyte abnormality Resolved with repletion -Continue to monitor and replete as needed   Consultants: Nephrology Procedures performed: None Disposition: Skilled nursing facility Diet recommendation:  Discharge Diet Orders (From admission, onward)     Start     Ordered   05/17/24 0000  Diet - low sodium heart healthy        05/17/24 1044           Regular diet DISCHARGE MEDICATION: Allergies as of 05/17/2024       Reactions   Morphine And Codeine Nausea Only   Oxycontin [oxycodone] Nausea Only   Percocet [oxycodone-acetaminophen ] Nausea Only   Morphine Nausea Only   Oxycodone-acetaminophen  Rash        Medication List     STOP taking these medications    losartan -hydrochlorothiazide 100-12.5 MG tablet Commonly known as: HYZAAR       TAKE these medications    amLODipine  10 MG tablet Commonly known as: NORVASC  Take 1 tablet (10 mg total) by mouth daily. What changed:  medication strength how much to take   aspirin  81 MG chewable tablet Chew by mouth daily.   atenolol  25 MG tablet Commonly known as: TENORMIN  Take 25 mg by mouth 2 (two) times daily.   benzonatate  100 MG capsule Commonly known as:  TESSALON  Take 1 capsule (100 mg total) by mouth 2 (two) times daily.   calcium  carbonate 1500 (600 Ca) MG Tabs tablet Commonly known as: OSCAL Take 600 mg of elemental calcium  by mouth 2 (two) times daily with a meal.   CENTRUM SILVER PO Take 1 tablet by mouth daily.   D3-1000 25 MCG (1000 UT) capsule Generic drug: Cholecalciferol  Take 1,000 Units by mouth daily.   dextromethorphan -guaiFENesin  30-600 MG 12hr tablet Commonly known as: MUCINEX  DM Take 1 tablet by mouth 2 (two) times daily.   docusate sodium  100 MG capsule Commonly known as: COLACE Take 1 capsule (100 mg total) by mouth daily.   feeding supplement Liqd Take 237 mLs by mouth 2 (two) times daily between meals.   fexofenadine 60 MG tablet Commonly known as: ALLEGRA Take 60 mg by mouth daily as needed for allergies.   lactulose  10 GM/15ML solution Commonly known as: CHRONULAC  Take 30 mLs (20 g total) by mouth 2 (two) times daily as needed for mild constipation or moderate constipation.   losartan  100 MG tablet Commonly known as: COZAAR  Take 1 tablet (100 mg total) by mouth daily.   Magnesium  500 MG Caps Take 500 mg by mouth daily.   omeprazole 20 MG capsule Commonly known as: PRILOSEC Take 20 mg by mouth 2 (two) times daily before a meal.   sodium  chloride 1 g tablet Take 1 tablet (1 g total) by mouth 3 (three) times daily with meals.        Follow-up Information     Yehuda Helms, MD. Schedule an appointment as soon as possible for a visit in 1 week(s).   Specialty: Internal Medicine Contact information: [address] [facility] [place] [address] [phone]         Lateef, Munsoor, MD. Schedule an appointment as soon as possible for a visit in 1 week(s).   Specialty: Nephrology Contact information: [address] [place] [address] [phone]         Cephus Collin, MD .   Specialty: Pain Medicine Contact information: [address]  [place] [address] [phone]                Discharge Exam: Cleavon Curls Weights   05/11/24 1052 05/12/24 0955  Weight: 56.7 kg 59.7 kg   General.  Frail elderly lady, in no acute distress. Pulmonary.  Lungs clear bilaterally, normal respiratory effort. CV.  Regular rate and rhythm, no JVD, rub or murmur. Abdomen.  Soft, nontender, nondistended, BS positive. CNS.  Alert and oriented .  No focal neurologic deficit. Extremities.  No edema, no cyanosis, pulses intact and symmetrical. Psychiatry.  Judgment and insight appears normal.   Condition at discharge: stable  The results of significant diagnostics from this hospitalization (including imaging, microbiology, ancillary and laboratory) are listed below for reference.   Imaging Studies: DG Chest Portable 1 View Result Date: 05/11/2024 CLINICAL DATA:  Shortness of breath, cough. EXAM: PORTABLE CHEST 1 VIEW COMPARISON:  September 21, 2017. FINDINGS: Stable cardiomegaly. Large hiatal hernia is noted. Lungs are clear. Bony thorax is unremarkable. IMPRESSION: Large hiatal hernia.  No acute pulmonary disease. Electronically Signed   By: Rosalene Colon M.D.   On: 05/11/2024 12:30    Microbiology: Results for orders placed or performed during the hospital encounter of 05/11/24  MRSA Next Gen by PCR, Nasal     Status: None   Collection Time: 05/11/24  7:00 PM   Specimen: Nasal Mucosa; Nasal Swab  Result Value Ref Range Status   MRSA by PCR Next Gen NOT DETECTED NOT DETECTED Final    Comment: (NOTE) The GeneXpert MRSA Assay (FDA approved for NASAL specimens only), is one component of a comprehensive MRSA colonization surveillance program. It is not intended to diagnose MRSA infection nor to guide or monitor treatment for MRSA infections. Test performance is not FDA approved in patients less than 57 years old. Performed at [facility], [address] [place], [place] [address]   MRSA Next Gen by PCR, Nasal      Status: None   Collection Time: 05/12/24  9:59 AM   Specimen: Nasal Mucosa; Nasal Swab  Result Value Ref Range Status   MRSA by PCR Next Gen NOT DETECTED NOT DETECTED Final    Comment: (NOTE) The GeneXpert MRSA Assay (FDA approved for NASAL specimens only), is one component of a comprehensive MRSA colonization surveillance program. It is not intended to diagnose MRSA infection nor to guide or monitor treatment for MRSA infections. Test performance is not FDA approved in patients less than 69 years old. Performed at [facility], [address] [place], [place] [address]   Respiratory (~20 pathogens) panel by PCR     Status: None   Collection Time: 05/12/24  3:10 PM   Specimen: Nasopharyngeal Swab; Respiratory  Result Value Ref  Range Status   Adenovirus NOT DETECTED NOT DETECTED Final   Coronavirus 229E NOT DETECTED NOT DETECTED Final    Comment: (NOTE) The Coronavirus on the Respiratory Panel, DOES NOT test for the novel  Coronavirus (2019 nCoV)    Coronavirus HKU1 NOT DETECTED NOT DETECTED Final   Coronavirus NL63 NOT DETECTED NOT DETECTED Final   Coronavirus OC43 NOT DETECTED NOT DETECTED Final   Metapneumovirus NOT DETECTED NOT DETECTED Final   Rhinovirus / Enterovirus NOT DETECTED NOT DETECTED Final   Influenza A NOT DETECTED NOT DETECTED Final   Influenza B NOT DETECTED NOT DETECTED Final   Parainfluenza Virus 1 NOT DETECTED NOT DETECTED Final   Parainfluenza Virus 2 NOT DETECTED NOT DETECTED Final   Parainfluenza Virus 3 NOT DETECTED NOT DETECTED Final   Parainfluenza Virus 4 NOT DETECTED NOT DETECTED Final   Respiratory Syncytial Virus NOT DETECTED NOT DETECTED Final   Bordetella pertussis NOT DETECTED NOT DETECTED Final   Bordetella Parapertussis NOT DETECTED NOT DETECTED Final   Chlamydophila pneumoniae NOT DETECTED NOT DETECTED Final   Mycoplasma pneumoniae NOT DETECTED NOT DETECTED Final    Comment: Performed at [facility], [address]  [place], [place] [address]    Labs: CBC: Recent Labs  Lab 05/11/24 1058 05/12/24 0510 05/13/24 0715 05/15/24 0544 05/16/24 0422 05/17/24 0531  WBC 12.0* 9.4 9.0 9.7 9.5 8.8  NEUTROABS 8.2*  --   --   --   --   --   HGB 15.1* 13.9 13.5 13.8 13.2 13.9  HCT 41.6 38.3 38.1 39.5 37.6 39.9  MCV 85.8 85.9 87.4 88.0 88.1 88.3  PLT 350 300 291 250 265 264   Basic Metabolic Panel: Recent Labs  Lab 05/13/24 0715 05/13/24 1558 05/15/24 0543 05/15/24 0544 05/15/24 0752 05/15/24 1545 05/15/24 2029 05/16/24 0422 05/16/24 1157 05/16/24 2014 05/17/24 0531  NA 128*   < >  --  124*   < > 122* 128* 128* 130* 131* 129*  K 3.5  --   --  3.4*  --  4.1  --  4.1  --   --  4.2  CL 100  --   --  92*  --  89*  --  96*  --   --  97*  CO2 21*  --   --  23  --  23  --  25  --   --  25  GLUCOSE 86  --   --  103*  --  150*  --  112*  --   --  112*  BUN 7*  --   --  14  --  16  --  14  --   --  19  CREATININE 0.50  --   --  0.41*  --  0.49  --  0.57  --   --  0.56  CALCIUM  7.8*  --   --  8.1*  --  8.2*  --  8.5*  --   --  8.8*  MG  --   --  1.9  --   --   --   --   --   --   --  2.1  PHOS  --   --  2.3*  --   --   --   --   --   --   --  3.4   < > = values in this interval not displayed.   Liver Function Tests: Recent Labs  Lab 05/15/24 1545 05/16/24 0422  05/17/24 0531  AST 30 25  --   ALT 22 22  --   ALKPHOS 76 56  --   BILITOT 0.7 0.8  --   PROT 5.6* 5.4*  --   ALBUMIN 3.1* 3.1* 3.2*   CBG: Recent Labs  Lab 05/12/24 0958 05/12/24 1226 05/12/24 1544  GLUCAP 158* 138* 127*    Discharge time spent: greater than 30 minutes.  This record has been created using Conservation officer, historic buildings. Errors have been sought and corrected,but may not always be located. Such creation errors do not reflect on the standard of care.   Signed: Luna Salinas, MD [facility] 05/17/2024

## 2024-05-17 NOTE — Plan of Care (Signed)
  Problem: Education: Goal: Knowledge of General Education information will improve Description: Including pain rating scale, medication(s)/side effects and non-pharmacologic comfort measures Outcome: Progressing   Problem: Nutrition: Goal: Adequate nutrition will be maintained Outcome: Progressing   Problem: Coping: Goal: Level of anxiety will decrease Outcome: Progressing   Problem: Elimination: Goal: Will not experience complications related to bowel motility Outcome: Progressing Goal: Will not experience complications related to urinary retention Outcome: Progressing   Problem: Pain Managment: Goal: General experience of comfort will improve and/or be controlled Outcome: Progressing   Problem: Safety: Goal: Ability to remain free from injury will improve Outcome: Progressing   Problem: Skin Integrity: Goal: Risk for impaired skin integrity will decrease Outcome: Progressing

## 2024-05-17 NOTE — Progress Notes (Signed)
 MEDICATION RELATED CONSULT NOTE  Pharmacy Consult for Tolvaptan  Indication: hyponatremia  Allergies  Allergen Reactions   Morphine And Codeine Nausea Only   Oxycontin [Oxycodone] Nausea Only   Percocet [Oxycodone-Acetaminophen ] Nausea Only   Morphine Nausea Only   Oxycodone-Acetaminophen  Rash    Patient Measurements: Height: 5' (152.4 cm) Weight: 59.7 kg (131 lb 9.8 oz) IBW/kg (Calculated) : 45.5   Vital Signs: Temp: 98.5 F (36.9 C) (05/20 2021) Temp Source: Oral (05/20 2021) BP: 156/95 (05/20 2021) Pulse Rate: 74 (05/20 2021) Intake/Output from previous day: 05/20 0701 - 05/21 0700 In: 360 [P.O.:360] Out: -  Intake/Output from this shift: No intake/output data recorded.  Labs: Recent Labs    05/15/24 0543 05/15/24 0544 05/15/24 0544 05/15/24 1545 05/16/24 0422 05/17/24 0531  WBC  --  9.7  --   --  9.5 8.8  HGB  --  13.8  --   --  13.2 13.9  HCT  --  39.5  --   --  37.6 39.9  PLT  --  250  --   --  265 264  CREATININE  --  0.41*   < > 0.49 0.57 0.56  MG 1.9  --   --   --   --  2.1  PHOS 2.3*  --   --   --   --  3.4  ALBUMIN  --   --   --  3.1* 3.1* 3.2*  PROT  --   --   --  5.6* 5.4*  --   AST  --   --   --  30 25  --   ALT  --   --   --  22 22  --   ALKPHOS  --   --   --  76 56  --   BILITOT  --   --   --  0.7 0.8  --   BILIDIR  --   --   --  0.1  --   --   IBILI  --   --   --  0.6  --   --    < > = values in this interval not displayed.   Estimated Creatinine Clearance: 35.5 mL/min (by C-G formula based on SCr of 0.56 mg/dL).   Medical History: Past Medical History:  Diagnosis Date   Arthritis    Dysrhythmia    Environmental and seasonal allergies    GERD (gastroesophageal reflux disease)    Hernia, hiatal    Hx of dysplastic nevus 09/13/2015   L mid back paraspinal, moderate atypia    Assessment: 89 yo female presented to the ED with primary complaint of weakness. Found to be hyponatremic with Na = 118.  Patient was previously on NaCl 3%  infusion from 5/16-5/17.  Most recent Na = 122.  Patient currently on NaCl 1 g po TID.  Nephrology has ordered Tolvaptan  15 mg po x 1.  5/19 0751 Na 122 5/19 1407 Tolvaptan  15 mg PO X 1 given 5/19 1545 Na 122 5/19 2029 Na 128 5/20 0422 Na 128 5/20 0922 Tolvaptan  po x 1 given 5/20 1157 Na 130 5/21 0531 Na 129  Goal of Therapy:  Desired rate of correction: should not exceed 8 mEq/L per 8 hours or 12 mEq/L in 24 hours.   Plan:  Tolvaptan  15 mg po x 1 ordered by nephrology Monitor Na level every 8 hours for the first 24 hours If too rapid a correction is noted please contact provider  Camren Lipsett D, PharmD 05/17/2024,6:47 AM

## 2024-05-17 NOTE — Progress Notes (Signed)
 MEDICATION RELATED CONSULT NOTE  Pharmacy Consult for Tolvaptan  Indication: hyponatremia  Allergies  Allergen Reactions   Morphine And Codeine Nausea Only   Oxycontin [Oxycodone] Nausea Only   Percocet [Oxycodone-Acetaminophen ] Nausea Only   Morphine Nausea Only   Oxycodone-Acetaminophen  Rash    Patient Measurements: Height: 5' (152.4 cm) Weight: 59.7 kg (131 lb 9.8 oz) IBW/kg (Calculated) : 45.5   Vital Signs: Temp: 98.5 F (36.9 C) (05/21 0755) Temp Source: Oral (05/21 0755) BP: 138/110 (05/21 0755) Pulse Rate: 73 (05/21 0755) Intake/Output from previous day: 05/20 0701 - 05/21 0700 In: 360 [P.O.:360] Out: -  Intake/Output from this shift: No intake/output data recorded.  Labs: Recent Labs    05/15/24 0543 05/15/24 0544 05/15/24 0544 05/15/24 1545 05/16/24 0422 05/17/24 0531  WBC  --  9.7  --   --  9.5 8.8  HGB  --  13.8  --   --  13.2 13.9  HCT  --  39.5  --   --  37.6 39.9  PLT  --  250  --   --  265 264  CREATININE  --  0.41*   < > 0.49 0.57 0.56  MG 1.9  --   --   --   --  2.1  PHOS 2.3*  --   --   --   --  3.4  ALBUMIN  --   --   --  3.1* 3.1* 3.2*  PROT  --   --   --  5.6* 5.4*  --   AST  --   --   --  30 25  --   ALT  --   --   --  22 22  --   ALKPHOS  --   --   --  76 56  --   BILITOT  --   --   --  0.7 0.8  --   BILIDIR  --   --   --  0.1  --   --   IBILI  --   --   --  0.6  --   --    < > = values in this interval not displayed.   Estimated Creatinine Clearance: 35.5 mL/min (by C-G formula based on SCr of 0.56 mg/dL).   Medical History: Past Medical History:  Diagnosis Date   Arthritis    Dysrhythmia    Environmental and seasonal allergies    GERD (gastroesophageal reflux disease)    Hernia, hiatal    Hx of dysplastic nevus 09/13/2015   L mid back paraspinal, moderate atypia    Assessment: 89 yo female presented to the ED with primary complaint of weakness. Found to be hyponatremic with Na = 118.  Patient was previously on NaCl 3%  infusion from 5/16-5/17.  Most recent Na = 122.  Patient currently on NaCl 1 g po TID.  Nephrology has ordered Tolvaptan  15 mg po x 1.  5/19 0751 Na 122 5/19 1407 Tolvaptan  15 mg PO X 1 given 5/19 1545 Na 122 5/19 2029 Na 128 5/20 0422 Na 128 5/20 0922 Tolvaptan  po x 1 given 5/20 1157 Na 130 5/21 0531 Na 129 5/21 1301 Na 129  Goal of Therapy:  Desired rate of correction: should not exceed 8 mEq/L per 8 hours or 12 mEq/L in 24 hours.   Plan:  Tolvaptan  15 mg po x 1 ordered by nephrology Monitor Na level every 8 hours for the first 24 hours If too rapid a correction is noted  please contact provider  Ramonita Burow, PharmD 05/17/2024,8:58 AM

## 2024-05-17 NOTE — TOC Progression Note (Addendum)
 Transition of Care [facility]) - Progression Note    Patient Details  Name: Heather Miranda MRN: [id number] Date of Birth: 06/13/1929  [facility] CM/SW Contact  Arminda Landmark, RN Phone Number: 05/17/2024, 8:52 AM  Clinical Narrative:    Eldora Greet with Debria Fang from [facility]. Katlynne has been approved for SNF and ambulance ride. The auth for SNF is good for 5 business days and she's been approved for the initial 7 days. Auth # for SNF is [id number], auth # for ambulance is [id number] and this is good for 90 days. 9147: LVM for Jullie Oiler at [facility] to ask if pt can come today if Dc'd. Waiting for return call. [phone]: Jullie Oiler called back and they can accept her today. She'll go to bed 334 and the # to call report is [phone]. Called her son Jonelle Neri and confirmed he'll drive her to the SNF. He'll be here around 1300 today. TOC to continue to follow   Expected Discharge Plan: Skilled Nursing Facility Barriers to Discharge: Continued Medical Work up  Expected Discharge Plan and Services     Post Acute Care Choice: Skilled Nursing Facility Living arrangements for the past 2 months: Independent Living Facility                                       Social Determinants of Health (SDOH) Interventions SDOH Screenings   Food Insecurity: No Food Insecurity (05/11/2024)  Housing: Low Risk  (05/11/2024)  Transportation Needs: No Transportation Needs (05/11/2024)  Utilities: Not At Risk (05/11/2024)  Social Connections: Moderately Integrated (05/11/2024)  Tobacco Use: Low Risk  (05/11/2024)    Readmission Risk Interventions     No data to display

## 2024-05-18 DIAGNOSIS — E871 Hypo-osmolality and hyponatremia: Secondary | ICD-10-CM | POA: Diagnosis not present

## 2024-05-18 DIAGNOSIS — I10 Essential (primary) hypertension: Secondary | ICD-10-CM | POA: Diagnosis not present

## 2024-05-24 DIAGNOSIS — E871 Hypo-osmolality and hyponatremia: Secondary | ICD-10-CM | POA: Diagnosis not present

## 2024-05-29 DIAGNOSIS — R2681 Unsteadiness on feet: Secondary | ICD-10-CM | POA: Diagnosis not present

## 2024-05-29 DIAGNOSIS — I10 Essential (primary) hypertension: Secondary | ICD-10-CM | POA: Diagnosis not present

## 2024-05-29 DIAGNOSIS — M6281 Muscle weakness (generalized): Secondary | ICD-10-CM | POA: Diagnosis not present

## 2024-05-29 DIAGNOSIS — R2689 Other abnormalities of gait and mobility: Secondary | ICD-10-CM | POA: Diagnosis not present

## 2024-05-29 DIAGNOSIS — E871 Hypo-osmolality and hyponatremia: Secondary | ICD-10-CM | POA: Diagnosis not present

## 2024-05-29 DIAGNOSIS — Z09 Encounter for follow-up examination after completed treatment for conditions other than malignant neoplasm: Secondary | ICD-10-CM | POA: Diagnosis not present

## 2024-05-31 DIAGNOSIS — R2681 Unsteadiness on feet: Secondary | ICD-10-CM | POA: Diagnosis not present

## 2024-05-31 DIAGNOSIS — E871 Hypo-osmolality and hyponatremia: Secondary | ICD-10-CM | POA: Diagnosis not present

## 2024-05-31 DIAGNOSIS — M6281 Muscle weakness (generalized): Secondary | ICD-10-CM | POA: Diagnosis not present

## 2024-06-06 DIAGNOSIS — R2681 Unsteadiness on feet: Secondary | ICD-10-CM | POA: Diagnosis not present

## 2024-06-06 DIAGNOSIS — M6281 Muscle weakness (generalized): Secondary | ICD-10-CM | POA: Diagnosis not present

## 2024-06-06 DIAGNOSIS — E871 Hypo-osmolality and hyponatremia: Secondary | ICD-10-CM | POA: Diagnosis not present

## 2024-06-07 DIAGNOSIS — M6281 Muscle weakness (generalized): Secondary | ICD-10-CM | POA: Diagnosis not present

## 2024-06-07 DIAGNOSIS — R2689 Other abnormalities of gait and mobility: Secondary | ICD-10-CM | POA: Diagnosis not present

## 2024-06-07 DIAGNOSIS — E871 Hypo-osmolality and hyponatremia: Secondary | ICD-10-CM | POA: Diagnosis not present

## 2024-06-26 DIAGNOSIS — N289 Disorder of kidney and ureter, unspecified: Secondary | ICD-10-CM | POA: Diagnosis not present

## 2024-07-04 DIAGNOSIS — M810 Age-related osteoporosis without current pathological fracture: Secondary | ICD-10-CM | POA: Diagnosis not present

## 2024-07-04 DIAGNOSIS — E118 Type 2 diabetes mellitus with unspecified complications: Secondary | ICD-10-CM | POA: Diagnosis not present

## 2024-07-04 DIAGNOSIS — I10 Essential (primary) hypertension: Secondary | ICD-10-CM | POA: Diagnosis not present

## 2024-07-04 DIAGNOSIS — Z79899 Other long term (current) drug therapy: Secondary | ICD-10-CM | POA: Diagnosis not present

## 2024-07-04 DIAGNOSIS — E7800 Pure hypercholesterolemia, unspecified: Secondary | ICD-10-CM | POA: Diagnosis not present

## 2024-07-04 DIAGNOSIS — E871 Hypo-osmolality and hyponatremia: Secondary | ICD-10-CM | POA: Diagnosis not present

## 2024-08-03 DIAGNOSIS — L821 Other seborrheic keratosis: Secondary | ICD-10-CM | POA: Diagnosis not present

## 2024-08-03 DIAGNOSIS — L814 Other melanin hyperpigmentation: Secondary | ICD-10-CM | POA: Diagnosis not present

## 2024-08-03 DIAGNOSIS — L820 Inflamed seborrheic keratosis: Secondary | ICD-10-CM | POA: Diagnosis not present

## 2024-08-04 DIAGNOSIS — Z79899 Other long term (current) drug therapy: Secondary | ICD-10-CM | POA: Diagnosis not present

## 2024-08-17 DIAGNOSIS — M1712 Unilateral primary osteoarthritis, left knee: Secondary | ICD-10-CM | POA: Diagnosis not present

## 2024-08-24 DIAGNOSIS — M25562 Pain in left knee: Secondary | ICD-10-CM | POA: Diagnosis not present

## 2024-08-31 DIAGNOSIS — M1712 Unilateral primary osteoarthritis, left knee: Secondary | ICD-10-CM | POA: Diagnosis not present

## 2024-09-04 DIAGNOSIS — M3501 Sicca syndrome with keratoconjunctivitis: Secondary | ICD-10-CM | POA: Diagnosis not present

## 2024-09-04 DIAGNOSIS — D3132 Benign neoplasm of left choroid: Secondary | ICD-10-CM | POA: Diagnosis not present

## 2024-09-04 DIAGNOSIS — H47323 Drusen of optic disc, bilateral: Secondary | ICD-10-CM | POA: Diagnosis not present

## 2024-09-04 DIAGNOSIS — H43813 Vitreous degeneration, bilateral: Secondary | ICD-10-CM | POA: Diagnosis not present

## 2024-09-21 DIAGNOSIS — Z79899 Other long term (current) drug therapy: Secondary | ICD-10-CM | POA: Diagnosis not present

## 2024-09-21 DIAGNOSIS — E118 Type 2 diabetes mellitus with unspecified complications: Secondary | ICD-10-CM | POA: Diagnosis not present

## 2024-09-21 DIAGNOSIS — I10 Essential (primary) hypertension: Secondary | ICD-10-CM | POA: Diagnosis not present

## 2024-09-28 DIAGNOSIS — L820 Inflamed seborrheic keratosis: Secondary | ICD-10-CM | POA: Diagnosis not present

## 2024-09-28 DIAGNOSIS — D1801 Hemangioma of skin and subcutaneous tissue: Secondary | ICD-10-CM | POA: Diagnosis not present

## 2024-09-28 DIAGNOSIS — L821 Other seborrheic keratosis: Secondary | ICD-10-CM | POA: Diagnosis not present

## 2024-09-28 DIAGNOSIS — L853 Xerosis cutis: Secondary | ICD-10-CM | POA: Diagnosis not present

## 2024-09-28 DIAGNOSIS — L814 Other melanin hyperpigmentation: Secondary | ICD-10-CM | POA: Diagnosis not present

## 2024-10-31 ENCOUNTER — Ambulatory Visit (INDEPENDENT_AMBULATORY_CARE_PROVIDER_SITE_OTHER): Admitting: Vascular Surgery

## 2024-10-31 ENCOUNTER — Encounter (INDEPENDENT_AMBULATORY_CARE_PROVIDER_SITE_OTHER): Payer: Self-pay | Admitting: Vascular Surgery

## 2024-10-31 ENCOUNTER — Ambulatory Visit (INDEPENDENT_AMBULATORY_CARE_PROVIDER_SITE_OTHER)

## 2024-10-31 VITALS — BP 116/64 | HR 60 | Resp 16 | Ht 60.0 in | Wt 128.0 lb

## 2024-10-31 DIAGNOSIS — I6523 Occlusion and stenosis of bilateral carotid arteries: Secondary | ICD-10-CM

## 2024-10-31 DIAGNOSIS — I10 Essential (primary) hypertension: Secondary | ICD-10-CM

## 2024-10-31 DIAGNOSIS — E119 Type 2 diabetes mellitus without complications: Secondary | ICD-10-CM | POA: Diagnosis not present

## 2024-10-31 NOTE — Assessment & Plan Note (Signed)
 Carotid duplex today shows 40 to 59% right ICA stenosis and 1 to 39% left ICA stenosis.  This represents a slight progression on the right but is similar to her study from 2 years ago.  No role for intervention at this level without symptoms.  Continue current medications including aspirin  daily.  Recheck in 1 year.

## 2024-10-31 NOTE — Progress Notes (Signed)
 MRN : [id number]  Heather Miranda is a 89 y.o. ([birth year]) female who presents with chief complaint of  Chief Complaint  Patient presents with   Follow-up    28yr carotid u/s follow up  .  History of Present Illness: Patient returns today in follow up of her carotid disease.  She is doing well today.  She has not had any focal neurologic symptoms. Specifically, the patient denies amaurosis fugax, speech or swallowing difficulties, or arm or leg weakness or numbness. Carotid duplex today shows 40 to 59% right ICA stenosis and 1 to 39% left ICA stenosis.  This represents a slight progression on the right but is similar to her study from 2 years ago.   Current Outpatient Medications  Medication Sig Dispense Refill   amLODipine  (NORVASC ) 10 MG tablet Take 1 tablet (10 mg total) by mouth daily.     aspirin  81 MG chewable tablet Chew by mouth daily.     atenolol  (TENORMIN ) 25 MG tablet Take 25 mg by mouth 2 (two) times daily.      benzonatate  (TESSALON ) 100 MG capsule Take 1 capsule (100 mg total) by mouth 2 (two) times daily.     calcium  carbonate (OSCAL) 1500 (600 Ca) MG TABS tablet Take 600 mg of elemental calcium  by mouth 2 (two) times daily with a meal.      Cholecalciferol  (D3-1000) 25 MCG (1000 UT) capsule Take 1,000 Units by mouth daily.     dextromethorphan -guaiFENesin  (MUCINEX  DM) 30-600 MG 12hr tablet Take 1 tablet by mouth 2 (two) times daily.     docusate sodium  (COLACE) 100 MG capsule Take 1 capsule (100 mg total) by mouth daily.     feeding supplement (ENSURE ENLIVE / ENSURE PLUS) LIQD Take 237 mLs by mouth 2 (two) times daily between meals.     fexofenadine (ALLEGRA) 60 MG tablet Take 60 mg by mouth daily as needed for allergies.      lactulose  (CHRONULAC ) 10 GM/15ML solution Take 30 mLs (20 g total) by mouth 2 (two) times daily as needed for mild constipation or moderate constipation.     losartan  (COZAAR ) 100 MG tablet Take 1 tablet (100 mg total) by mouth daily.      Magnesium  500 MG CAPS Take 500 mg by mouth daily.     Multiple Vitamins-Minerals (CENTRUM SILVER PO) Take 1 tablet by mouth daily.     omeprazole (PRILOSEC) 20 MG capsule Take 20 mg by mouth 2 (two) times daily before a meal.      sodium chloride  1 g tablet Take 1 tablet (1 g total) by mouth 3 (three) times daily with meals. (Patient taking differently: Take 1 g by mouth daily.)     No current facility-administered medications for this visit.    Past Medical History:  Diagnosis Date   Arthritis    Dysrhythmia    Environmental and seasonal allergies    GERD (gastroesophageal reflux disease)    Hernia, hiatal    Hx of dysplastic nevus 09/13/2015   L mid back paraspinal, moderate atypia    Past Surgical History:  Procedure Laterality Date   abd cyst removed     BACK SURGERY  09/29/2017   CATARACT EXTRACTION W/ INTRAOCULAR LENS  IMPLANT, BILATERAL     DUPUYTREN CONTRACTURE RELEASE Left    KNEE SURGERY     right   LUMBAR LAMINECTOMY/DECOMPRESSION MICRODISCECTOMY N/A 09/29/2017   Procedure: LUMBAR LAMINECTOMY/DECOMPRESSION MICRODISCECTOMY 1 LEVEL L4-5;  Surgeon: Clois Fret, MD;  Location: [facility];  Service: Neurosurgery;  Laterality: N/A;   ROTATOR CUFF REPAIR Right    WRIST SURGERY     right     Social History   Tobacco Use   Smoking status: Never   Smokeless tobacco: Never  Vaping Use   Vaping status: Never Used  Substance Use Topics   Alcohol use: Yes    Alcohol/week: 4.0 standard drinks of alcohol    Types: 4 Glasses of wine per week   Drug use: No       Family History  Problem Relation Age of Onset   Breast cancer Mother 32   Breast cancer Maternal Aunt      Allergies  Allergen Reactions   Morphine And Codeine Nausea Only   Oxycontin [Oxycodone] Nausea Only   Percocet [Oxycodone-Acetaminophen ] Nausea Only   Morphine Nausea Only   Oxycodone-Acetaminophen  Rash     REVIEW OF SYSTEMS (Negative unless checked)   Constitutional: [] Weight loss   [] Fever  [] Chills Cardiac: [] Chest pain   [] Chest pressure   [x] Palpitations   [] Shortness of breath when laying flat   [] Shortness of breath at rest   [] Shortness of breath with exertion. Vascular:  [x] Pain in legs with walking   [] Pain in legs at rest   [] Pain in legs when laying flat   [] Claudication   [] Pain in feet when walking  [] Pain in feet at rest  [] Pain in feet when laying flat   [] History of DVT   [] Phlebitis   [x] Swelling in legs   [] Varicose veins   [] Non-healing ulcers Pulmonary:   [] Uses home oxygen   [] Productive cough   [] Hemoptysis   [] Wheeze  [] COPD   [] Asthma Neurologic:  [] Dizziness  [] Blackouts   [] Seizures   [] History of stroke   [] History of TIA  [] Aphasia   [] Temporary blindness   [] Dysphagia   [] Weakness or numbness in arms   [] Weakness or numbness in legs Musculoskeletal:  [x] Arthritis   [] Joint swelling   [] Joint pain   [x] Low back pain Hematologic:  [] Easy bruising  [] Easy bleeding   [] Hypercoagulable state   [] Anemic  [] Hepatitis Gastrointestinal:  [] Blood in stool   [] Vomiting blood  [x] Gastroesophageal reflux/heartburn   [] Difficulty swallowing. Genitourinary:  [] Chronic kidney disease   [] Difficult urination  [] Frequent urination  [] Burning with urination   [] Blood in urine Skin:  [] Rashes   [] Ulcers   [] Wounds Psychological:  [] History of anxiety   []  History of major depression.  Physical Examination  BP 116/64   Pulse 60   Resp 16   Ht 5' (1.524 m)   Wt 128 lb (58.1 kg)   BMI 25.00 kg/m  Gen:  WD/WN, NAD. Appears younger than stated age. Head: [place]/AT, No temporalis wasting. Ear/Nose/Throat: Hearing grossly intact, nares w/o erythema or drainage Eyes: Conjunctiva clear. Sclera non-icteric Neck: Supple.  Trachea midline Pulmonary:  Good air movement, no use of accessory muscles.  Cardiac: slightly bradycardic Vascular:  Vessel  Left  Radial  Palpable               Musculoskeletal: M/S 5/5 throughout.  No deformity or atrophy. No edema. Neurologic:  Sensation grossly intact in extremities.  Symmetrical.  Speech is fluent.  Psychiatric: Judgment intact, Mood & affect appropriate for pt's clinical situation. Dermatologic: No rashes or ulcers noted.  No cellulitis or open wounds.      Labs No results found for this or any previous visit (from the past 2160 hours).  Radiology No results found.  Assessment/Plan  Carotid stenosis Carotid duplex today  shows 40 to 59% right ICA stenosis and 1 to 39% left ICA stenosis.  This represents a slight progression on the right but is similar to her study from 2 years ago.  No role for intervention at this level without symptoms.  Continue current medications including aspirin  daily.  Recheck in 1 year.  Benign essential hypertension blood pressure control important in reducing the progression of atherosclerotic disease. On appropriate oral medications.   Type 2 diabetes mellitus without complication (HCC) blood glucose control important in reducing the progression of atherosclerotic disease. Also, involved in wound healing. On appropriate medications.  Selinda Gu, MD  10/31/2024 1:55 PM    This note was created with Dragon medical transcription system.  Any errors from dictation are purely unintentional

## 2024-11-06 DIAGNOSIS — I10 Essential (primary) hypertension: Secondary | ICD-10-CM | POA: Diagnosis not present

## 2024-11-06 DIAGNOSIS — E118 Type 2 diabetes mellitus with unspecified complications: Secondary | ICD-10-CM | POA: Diagnosis not present

## 2024-11-06 DIAGNOSIS — G8929 Other chronic pain: Secondary | ICD-10-CM | POA: Diagnosis not present

## 2024-11-06 DIAGNOSIS — E7800 Pure hypercholesterolemia, unspecified: Secondary | ICD-10-CM | POA: Diagnosis not present

## 2024-11-06 DIAGNOSIS — M25562 Pain in left knee: Secondary | ICD-10-CM | POA: Diagnosis not present

## 2024-11-06 DIAGNOSIS — Z79899 Other long term (current) drug therapy: Secondary | ICD-10-CM | POA: Diagnosis not present

## 2024-11-06 DIAGNOSIS — E871 Hypo-osmolality and hyponatremia: Secondary | ICD-10-CM | POA: Diagnosis not present

## 2024-12-05 DIAGNOSIS — M1712 Unilateral primary osteoarthritis, left knee: Secondary | ICD-10-CM | POA: Diagnosis not present

## 2025-11-06 ENCOUNTER — Encounter (INDEPENDENT_AMBULATORY_CARE_PROVIDER_SITE_OTHER)

## 2025-11-06 ENCOUNTER — Ambulatory Visit (INDEPENDENT_AMBULATORY_CARE_PROVIDER_SITE_OTHER): Admitting: Vascular Surgery
